# Patient Record
Sex: Female | Born: 1955 | Race: White | Hispanic: Yes | Marital: Married | Smoking: Never smoker
Health system: Southern US, Community
[De-identification: ages and names within clinical notes are randomized; demographics above are authoritative.]

## PROBLEM LIST (undated history)

## (undated) DIAGNOSIS — F419 Anxiety disorder, unspecified: Secondary | ICD-10-CM

## (undated) DIAGNOSIS — M810 Age-related osteoporosis without current pathological fracture: Secondary | ICD-10-CM

## (undated) DIAGNOSIS — J45909 Unspecified asthma, uncomplicated: Secondary | ICD-10-CM

## (undated) HISTORY — PX: POLYPECTOMY: SHX149

## (undated) HISTORY — DX: Age-related osteoporosis without current pathological fracture: M81.0

## (undated) HISTORY — DX: Unspecified asthma, uncomplicated: J45.909

## (undated) HISTORY — DX: Anxiety disorder, unspecified: F41.9

---

## 2017-02-21 LAB — HM MAMMOGRAPHY

## 2018-03-27 LAB — HM MAMMOGRAPHY

## 2019-02-23 LAB — HEPATIC FUNCTION PANEL
ALT: 63 — AB (ref 7–35)
AST: 45 — AB (ref 13–35)
Alkaline Phosphatase: 132 — AB (ref 25–125)
Bilirubin, Total: 0.7

## 2019-02-23 LAB — HEMOGLOBIN A1C: Hemoglobin A1C: >14

## 2019-02-23 LAB — LIPID PANEL
Cholesterol: 213 — AB (ref 0–200)
HDL: 39 (ref 35–70)
LDL Cholesterol: 118
Triglycerides: 278 — AB (ref 40–160)

## 2019-02-23 LAB — BASIC METABOLIC PANEL
BUN: 14 (ref 4–21)
CO2: 24 — AB (ref 13–22)
Chloride: 100 (ref 99–108)
Creatinine: 1.1 (ref 0.5–1.1)
Glucose: 403
Potassium: 3.9 (ref 3.4–5.3)
Sodium: 135 — AB (ref 137–147)

## 2019-02-23 LAB — COMPREHENSIVE METABOLIC PANEL
Albumin: 3.8 (ref 3.5–5.0)
Calcium: 9.3 (ref 8.7–10.7)
GFR calc non Af Amer: 50
Globulin: 3.9

## 2019-04-10 LAB — HM MAMMOGRAPHY

## 2019-10-26 LAB — BASIC METABOLIC PANEL
BUN: 18 (ref 4–21)
CO2: 25 — AB (ref 13–22)
Chloride: 104 (ref 99–108)
Creatinine: 1.2 — AB (ref 0.5–1.1)
Glucose: 146
Potassium: 4.5 (ref 3.4–5.3)
Sodium: 141 (ref 137–147)

## 2019-10-26 LAB — CBC: RBC: 5.3 — AB (ref 3.87–5.11)

## 2019-10-26 LAB — COMPREHENSIVE METABOLIC PANEL
Albumin: 4.7 (ref 3.5–5.0)
Calcium: 10.1 (ref 8.7–10.7)
GFR calc non Af Amer: 50
Globulin: 2.7

## 2019-10-26 LAB — LIPID PANEL
Cholesterol: 271 — AB (ref 0–200)
HDL: 39 (ref 35–70)
LDL Cholesterol: 197
Triglycerides: 171 — AB (ref 40–160)

## 2019-10-26 LAB — HEPATIC FUNCTION PANEL
ALT: 25 (ref 7–35)
AST: 21 (ref 13–35)
Alkaline Phosphatase: 97 (ref 25–125)
Bilirubin, Direct: 0.2 (ref 0.01–0.4)
Bilirubin, Total: 0.5

## 2019-10-26 LAB — CBC AND DIFFERENTIAL
HCT: 49 — AB (ref 36–46)
Hemoglobin: 15.7 (ref 12.0–16.0)
Neutrophils Absolute: 3
Platelets: 295 (ref 150–399)
WBC: 5.8

## 2019-10-26 LAB — HEMOGLOBIN A1C: Hemoglobin A1C: 8.7

## 2019-10-26 LAB — TSH: TSH: 1.32 (ref 0.41–5.90)

## 2019-10-26 LAB — HM DEXA SCAN

## 2019-10-26 LAB — VITAMIN D 25 HYDROXY (VIT D DEFICIENCY, FRACTURES): Vit D, 25-Hydroxy: 37.4

## 2020-04-19 ENCOUNTER — Other Ambulatory Visit: Payer: Self-pay | Admitting: Family Medicine

## 2020-04-19 ENCOUNTER — Ambulatory Visit: Payer: Self-pay | Admitting: Family Medicine

## 2020-04-19 ENCOUNTER — Encounter: Payer: Self-pay | Admitting: Family Medicine

## 2020-04-19 ENCOUNTER — Other Ambulatory Visit: Payer: Self-pay

## 2020-04-19 VITALS — BP 100/63 | HR 79 | Temp 97.5°F | Resp 16 | Ht 65.0 in | Wt 137.6 lb

## 2020-04-19 DIAGNOSIS — Z124 Encounter for screening for malignant neoplasm of cervix: Secondary | ICD-10-CM

## 2020-04-19 DIAGNOSIS — J4520 Mild intermittent asthma, uncomplicated: Secondary | ICD-10-CM | POA: Diagnosis not present

## 2020-04-19 DIAGNOSIS — E1169 Type 2 diabetes mellitus with other specified complication: Secondary | ICD-10-CM | POA: Insufficient documentation

## 2020-04-19 DIAGNOSIS — Z7689 Persons encountering health services in other specified circumstances: Secondary | ICD-10-CM

## 2020-04-19 DIAGNOSIS — E1165 Type 2 diabetes mellitus with hyperglycemia: Secondary | ICD-10-CM | POA: Diagnosis not present

## 2020-04-19 DIAGNOSIS — E538 Deficiency of other specified B group vitamins: Secondary | ICD-10-CM | POA: Diagnosis not present

## 2020-04-19 DIAGNOSIS — Z0000 Encounter for general adult medical examination without abnormal findings: Secondary | ICD-10-CM

## 2020-04-19 DIAGNOSIS — G720 Drug-induced myopathy: Secondary | ICD-10-CM | POA: Insufficient documentation

## 2020-04-19 DIAGNOSIS — E785 Hyperlipidemia, unspecified: Secondary | ICD-10-CM

## 2020-04-19 DIAGNOSIS — K760 Fatty (change of) liver, not elsewhere classified: Secondary | ICD-10-CM | POA: Insufficient documentation

## 2020-04-19 DIAGNOSIS — L237 Allergic contact dermatitis due to plants, except food: Secondary | ICD-10-CM

## 2020-04-19 DIAGNOSIS — J45909 Unspecified asthma, uncomplicated: Secondary | ICD-10-CM | POA: Insufficient documentation

## 2020-04-19 DIAGNOSIS — Z1159 Encounter for screening for other viral diseases: Secondary | ICD-10-CM

## 2020-04-19 DIAGNOSIS — E1121 Type 2 diabetes mellitus with diabetic nephropathy: Secondary | ICD-10-CM | POA: Insufficient documentation

## 2020-04-19 DIAGNOSIS — Z1211 Encounter for screening for malignant neoplasm of colon: Secondary | ICD-10-CM

## 2020-04-19 DIAGNOSIS — E559 Vitamin D deficiency, unspecified: Secondary | ICD-10-CM

## 2020-04-19 LAB — POCT UA - MICROALBUMIN: Microalbumin Ur, POC: 0 mg/L

## 2020-04-19 MED ORDER — TRIAMCINOLONE ACETONIDE 0.5 % EX CREA: 1.0000 "application " | TOPICAL_CREAM | Freq: Two times a day (BID) | CUTANEOUS | 0 refills | Status: DC

## 2020-04-19 MED ORDER — ALBUTEROL SULFATE HFA 108 (90 BASE) MCG/ACT IN AERS: 2.0000 | INHALATION_SPRAY | RESPIRATORY_TRACT | 2 refills | Status: DC | PRN

## 2020-04-19 NOTE — Patient Instructions (Addendum)
Thank you for coming to the office today.  Poison ivy - use topical steroid cream twice a day for 1-2 weeks.  Colon Cancer Screening: - For all adults age 62+ routine colon cancer screening is highly recommended.     - Recent guidelines from [place] recommend starting age of 61 - Early detection of colon cancer is important, because often there are no warning signs or symptoms, also if found early usually it can be cured. Late stage is hard to treat.  - If you are not interested in Colonoscopy screening (if done and normal you could be cleared for 5 to 10 years until next due), then Cologuard is an excellent alternative for screening test for Colon Cancer. It is highly sensitive for detecting DNA of colon cancer from even the earliest stages. Also, there is NO bowel prep required. - If Cologuard is NEGATIVE, then it is good for 3 years before next due - If Cologuard is POSITIVE, then it is strongly advised to get a Colonoscopy, which allows the GI doctor to locate the source of the cancer or polyp (even very early stage) and treat it by removing it. ------------------------- ORDERED Cologuard  Follow instructions to collect sample, you may call the company for any help or questions, 24/7 telephone support at [phone].  ------------------------------  Referral to GYN today. Stay tuned they will call  [facility] [address] [place], [place] [address] Hours: [place] Main: [place]   Address: [address], [place], [place], [place], [place] [address] Hours: 8AM-5PM Phone: [phone]  -------------------------------  Your provider would like to you have your annual eye exam. Please contact your current eye doctor or here are some good options for you to contact.   [facility]   Address: [address] [place], [place] [address] Phone: [phone]  Website: [email or web address] [address], [place], [place] [address] Phone: [phone] [email or web address]  [facility]  Address: [place], [place], [place] [address] Phone: [phone]   [facility] [address] [place], [place] [address] Phone: [phone]  [facility] Address: [place], [place], [place] [address]  Phone: [phone]    DUE for [place] (no food or drink after midnight before the lab appointment, only water or coffee without cream/sugar on the morning of)  SCHEDULE "Lab Only" visit in the morning at the clinic for lab draw in [place]   - Make sure Lab Only appointment is at about 1 week before your next appointment, so that results will be available  For Lab Results, once available within 2-3 days of blood draw, you can can log in to MyChart online to view your results and a brief explanation. Also, we can discuss results at next follow-up visit.     Please schedule a Follow-up Appointment to: Return in about 4 weeks (around 05/17/2020) for Annual Physical.  If you have any other questions or concerns, please feel free to call the office or send a message through [place]. You may also schedule an earlier appointment if necessary.  Additionally, you may be receiving a survey about your experience at our office within a few days to 1 week by e-mail or mail. We value your feedback.  Nobie Putnam, DO [place]

## 2020-04-19 NOTE — Progress Notes (Signed)
Subjective:    Patient ID: Holly Steele, female    DOB: Dec 16, 1955, 64 y.o.   MRN: [id number]  Holly Steele is a 64 y.o. female presenting on 04/19/2020 for Establish Care (rash may be heat rash per pt onset last week)  Husband Zenia Resides my patient recently established.  Moved to [place] from [place] to be closer to his son who works for [employer], now lives with them, and his wife has retired.  HPI   CHRONIC DM, Type 2: Reports history of low sugars in past then dx DM2. She did change lifestyle diet to Vegan She says difficulty with managing diet overall. CBGs - continuous glucose monitor, DexCom6  Meds: Ozempic 1mg  weekly inj, Jardiance 25mg  daily. Reports good compliance. Tolerating well w/o side-effects Not on ACEi/ARB due for urine microalbumin Lifestyle: - Diet (better low carb diet overall)  Denies hypoglycemia, polyuria, visual changes, numbness or tingling.  Vitamin Deficiency B12 Malabsorption Previously would receive Vitamin B12 vaccine. Taking oral Vitamin B12 1018mcg She admits vegan diet Due for testing  Hyperlipidemia / Fatty Liver History of chronic elevated cholesterol with complication of liver. Prior lab testing. Failed Rosuvastatin 20mg  due to myalgias She admits more sensitive to medications  History of Childhood Asthma Outgrew around age 70 She says still can be triggered by heavier smoke or fumes can get mild wheezing. She does not have a rescue inhaler for now.  Osteopenia Previously bone mineral density DEXA 10/2019 On Alendronate 70mg  weekly   Health Maintenance: UTD COVID19 vaccine  Colon CA Screening: Last Colonoscopy done 10-12 years ago GI in [place], no results but she says it was normal, will request record. Currently asymptomatic. No known family history of colon CA. Due for screening test - cologuard  Previously routine pap smear every 1-3 years, she has had abnormal pap in past, ultimately negative. Needs new GYN  Depression  screen PHQ 2/9 04/19/2020  Decreased Interest 0  Down, Depressed, Hopeless 0  PHQ - 2 Score 0    Past Medical History:  Diagnosis Date  . Asthma    past  . Osteoporosis    Past Surgical History:  Procedure Laterality Date  . POLYPECTOMY     Social History   Socioeconomic History  . Marital status: Married    Spouse name: Not on file  . Number of children: Not on file  . Years of education: Not on file  . Highest education level: Not on file  Occupational History  . Not on file  Tobacco Use  . Smoking status: Never Smoker  . Smokeless tobacco: Never Used  Substance and Sexual Activity  . Alcohol use: Yes  . Drug use: Never  . Sexual activity: Not on file  Other Topics Concern  . Not on file  Social History Narrative  . Not on file   Social Determinants of Health   Financial Resource Strain:   . Difficulty of Paying Living Expenses:   Food Insecurity:   . Worried About Charity fundraiser in the Last Year:   . Arboriculturist in the Last Year:   Transportation Needs:   . Film/video editor (Medical):   [name] Lack of Transportation (Non-Medical):   Physical Activity:   . Days of Exercise per Week:   . Minutes of Exercise per Session:   Stress:   . Feeling of Stress :   Social Connections:   . Frequency of Communication with Friends and Family:   . Frequency of Social Gatherings with  Friends and Family:   . Attends Religious Services:   . Active Member of Clubs or Organizations:   . Attends Archivist Meetings:   [name] Marital Status:   Intimate Partner Violence:   . Fear of Current or Ex-Partner:   . Emotionally Abused:   [name] Physically Abused:   . Sexually Abused:    History reviewed. No pertinent family history. Current Outpatient Medications on File Prior to Visit  Medication Sig  . alendronate (FOSAMAX) 70 MG tablet   . calcium acetate, Phos Binder, (PHOSLYRA) 667 MG/5ML SOLN Take 667 mg by mouth 3 (three) times daily with meals.  .  calcium-vitamin D (OSCAL WITH D) 500-200 MG-UNIT tablet Take 1 tablet by mouth.  . co-enzyme Q-10 30 MG capsule Take 30 mg by mouth daily.  [name] GLUTAMINE PO Take 1,000 mcg by mouth.  [name] JARDIANCE 25 MG TABS tablet Take 25 mg by mouth daily.  [name] OZEMPIC, 1 MG/DOSE, 2 MG/1.5ML SOPN Inject 1 mg into the skin once a week.  . traZODone (DESYREL) 100 MG tablet   . vitamin B-12 (CYANOCOBALAMIN) 1000 MCG tablet Take 1,000 mcg by mouth daily.   No current facility-administered medications on file prior to visit.    Review of Systems Per HPI unless specifically indicated above      Objective:    BP 100/63   Pulse 79   Temp (!) 97.5 F (36.4 C) (Temporal)   Resp 16   Ht 5\' 5"  (1.651 m)   Wt 137 lb 9.6 oz (62.4 kg)   SpO2 100%   BMI 22.90 kg/m   Wt Readings from Last 3 Encounters:  04/19/20 137 lb 9.6 oz (62.4 kg)    Physical Exam Vitals and nursing note reviewed.  Constitutional:      General: She is not in acute distress.    Appearance: She is well-developed. She is not diaphoretic.     Comments: Well-appearing, comfortable, cooperative  HENT:     Head: Normocephalic and atraumatic.  Eyes:     General:        Right eye: No discharge.        Left eye: No discharge.     Conjunctiva/sclera: Conjunctivae normal.  Cardiovascular:     Rate and Rhythm: Normal rate.  Pulmonary:     Effort: Pulmonary effort is normal.  Skin:    General: Skin is warm and dry.     Findings: Rash (several linear excoriations and patches on upper arms healing) present. No erythema.  Neurological:     Mental Status: She is alert and oriented to person, place, and time.  Psychiatric:        Behavior: Behavior normal.     Comments: Well groomed, good eye contact, normal speech and thoughts      Diabetic Foot Exam - Simple   Simple Foot Form Diabetic Foot exam was performed with the following findings: Yes 04/19/2020 11:20 AM  Visual Inspection No deformities, no ulcerations, no other skin breakdown  bilaterally: Yes Sensation Testing Intact to touch and monofilament testing bilaterally: Yes Pulse Check Posterior Tibialis and Dorsalis pulse intact bilaterally: Yes Comments     No results found for this or any previous visit.    Assessment & Plan:   Problem List Items Addressed This Visit    Vitamin B12 deficiency   Relevant Orders   Vitamin B12   Type 2 diabetes mellitus with hyperglycemia, without long-term current use of insulin ([place]) - Primary   Relevant Medications  JARDIANCE 25 MG TABS tablet   OZEMPIC, 1 MG/DOSE, 2 MG/1.5ML SOPN   Other Relevant Orders   POCT UA - Microalbumin   C-peptide   Hyperlipidemia associated with type 2 diabetes mellitus ([place])   Relevant Medications   JARDIANCE 25 MG TABS tablet   OZEMPIC, 1 MG/DOSE, 2 MG/1.5ML SOPN   Drug-induced myopathy   Asthma in adult   Relevant Medications   albuterol (VENTOLIN HFA) 108 (90 Base) MCG/ACT inhaler    Other Visit Diagnoses    Encounter to establish care with new doctor       Screen for colon cancer       Relevant Orders   Cologuard   Allergic dermatitis due to poison ivy       Relevant Medications   triamcinolone cream (KENALOG) 0.5 %   Vitamin D deficiency       Relevant Orders   VITAMIN D 25 Hydroxy (Vit-D Deficiency, Fractures)      Request outside records - [facility] - Dr Golden Circle MD - prior PCP [facility] records 2018-2020 - [place], colonoscopy initial screening 2009, age 53 - [place] for Mammogram May 2020  #Type 2 DM Prior report had hyperglycemia, complication No hypoglycemia Has done fairly well lately higher sugars Has continuous glucose monitor On ozempic 1mg  weekly and Jardiance 25mg  daily - she has enough meds Urine microalbumin negative 0 today DM Foot exam today Handout for DM eye providers to schedule Follow-up A1c and labs 1  month  Hyperlipidemia Secondary to DM History of hepatic steatosis Drug myopathy on statin - may consider other doses after upcoming lab panel Reduce from Rosuva 20 daily to reduced dose likely next option  #Vitamin D / B12 Deficiency History of vitamin Deficiency related to diabetes and nutrition She was on vegan diet Prior improved w/ B12 injections, now on oral supplement 1048mcg daily Check Lab B12, Vitamin D Upcoming labs for annual in 1 month for rest of blood panel CBC etc.  #Poison Ivy Dermatitis 1 week ago onset exposure and onset rash on arms classic for poison ivy Trial on topical Triamcinolone 0.5% BID 1-2 weeks, now that it is already healing, defer prednisone in diabetic patient for now Precautions reviewed  Due for routine colon cancer screening. 1st screening colonoscopy done 2009 [facility], request record No fam history colon CA - Discussion today about recommendations for either Colonoscopy or Cologuard screening, benefits and risks of screening, interested in Cologuard, understands that if positive then recommendation is for diagnostic colonoscopy to follow-up. - Ordered Cologuard today  Orders Placed This Encounter  Procedures  . Cologuard  . Vitamin B12  . VITAMIN D 25 Hydroxy (Vit-D Deficiency, Fractures)  . C-peptide  . Ambulatory referral to Obstetrics / Gynecology    Referral Priority:   Routine    Referral Type:   Consultation    Referral Reason:   Specialty Services Required    Requested Specialty:   Obstetrics and Gynecology    Number of Visits Requested:   1  . POCT UA - Microalbumin     Meds ordered this encounter  Medications  . albuterol (VENTOLIN HFA) 108 (90 Base) MCG/ACT inhaler    Sig: Inhale 2 puffs into the lungs every 4 (four) hours as needed for wheezing or shortness of breath (cough).    Dispense:  6.7 g    Refill:  2  . triamcinolone cream (  KENALOG) 0.5 %    Sig: Apply 1 application topically 2 (two) times daily. To affected  areas, for up to 2 weeks.    Dispense:  30 g    Refill:  0   referral to GYN first available, routine pap smear, prior records [place].   Follow up plan: Return in about 4 weeks (around 05/17/2020) for Annual Physical.   Future labs ordered for 4-6 weeks - CMET Lipid A1c CBC Hep C, TSH   Nobie Putnam, DO [facility] 04/19/2020, 10:43 AM

## 2020-04-19 NOTE — Addendum Note (Signed)
Addended by: Olin Hauser on: 04/19/2020 01:11 PM   Modules accepted: Orders

## 2020-04-20 ENCOUNTER — Telehealth: Payer: Self-pay | Admitting: Family Medicine

## 2020-04-20 DIAGNOSIS — F5101 Primary insomnia: Secondary | ICD-10-CM

## 2020-04-20 LAB — VITAMIN D 25 HYDROXY (VIT D DEFICIENCY, FRACTURES): Vit D, 25-Hydroxy: 70 ng/mL (ref 30–100)

## 2020-04-20 LAB — C-PEPTIDE: C-Peptide: 1.34 ng/mL (ref 0.80–3.85)

## 2020-04-20 LAB — VITAMIN B12: Vitamin B-12: 1556 pg/mL — ABNORMAL HIGH (ref 200–1100)

## 2020-04-20 MED ORDER — TRAZODONE HCL 100 MG PO TABS: 100.0000 mg | ORAL_TABLET | Freq: Every day | ORAL | 3 refills | Status: DC

## 2020-04-20 NOTE — Telephone Encounter (Signed)
Medication Refill - Medication:  traZODone (DESYREL) 100 MG tablet  Pt just seen Dr Raliegh Ip yesterday   Has the patient contacted their pharmacy? No. (Agent: If no, request that the patient contact the pharmacy for the refill.) (Agent: If yes, when and what did the pharmacy advise?)  Preferred Pharmacy (with phone number or street name): [facility] mail order   Agent: Please be advised that RX refills may take up to 3 business days. We ask that you follow-up with your pharmacy.

## 2020-04-23 ENCOUNTER — Encounter: Payer: Self-pay | Admitting: Family Medicine

## 2020-04-25 LAB — HM DIABETES EYE EXAM

## 2020-04-27 LAB — COLOGUARD: Cologuard: NEGATIVE

## 2020-05-01 ENCOUNTER — Encounter: Payer: Self-pay | Admitting: Family Medicine

## 2020-05-01 LAB — COLOGUARD: COLOGUARD: NEGATIVE

## 2020-05-05 ENCOUNTER — Other Ambulatory Visit: Payer: Self-pay

## 2020-05-05 ENCOUNTER — Telehealth: Payer: Self-pay

## 2020-05-05 ENCOUNTER — Ambulatory Visit (INDEPENDENT_AMBULATORY_CARE_PROVIDER_SITE_OTHER): Payer: No Typology Code available for payment source | Admitting: Family Medicine

## 2020-05-05 ENCOUNTER — Encounter: Payer: Self-pay | Admitting: Family Medicine

## 2020-05-05 VITALS — BP 100/60 | HR 100 | Temp 97.9°F | Ht 65.0 in | Wt 140.0 lb

## 2020-05-05 DIAGNOSIS — R21 Rash and other nonspecific skin eruption: Secondary | ICD-10-CM

## 2020-05-05 DIAGNOSIS — L237 Allergic contact dermatitis due to plants, except food: Secondary | ICD-10-CM

## 2020-05-05 MED ORDER — PREDNISONE 10 MG PO TABS: ORAL_TABLET | ORAL | 0 refills | Status: AC

## 2020-05-05 NOTE — Telephone Encounter (Signed)
Called pt to s

## 2020-05-05 NOTE — Telephone Encounter (Signed)
Called pt appt is schedule for today 05/05/2020.  KP

## 2020-05-05 NOTE — Telephone Encounter (Signed)
Called pt with Negative Cologaurd results. Pt verbalized understanding. Pt said her poison ivy rash has gotten bigger. The dermatologist cant see her until Oct. Wants to know if you can prescribe her something for her to go pick up or if you wanted to see her in office.  KP

## 2020-05-05 NOTE — Patient Instructions (Signed)
As we discussed, I have sent in a prescription for prednisone to take on a taper over the next 9 days.  Be sure to keep the skin clean and dry.  This medication will increase your blood sugar, be sure to be mindful of this and be aware of your sugar and carbohydrate intake over these next two weeks.  We will plan to see you back as needed for this  You will receive a survey after today's visit either digitally by e-mail or paper by [place] mail. Your experiences and feedback matter to [place].  Please respond so we know how we are doing as we provide care for you.  Call us with any questions/concerns/needs.  It is my goal to be available to you for your health concerns.  Thanks for choosing me to be a partner in your healthcare needs!  Harlin Rain, FNP-C Family Nurse Practitioner [facility] Phone: [phone]

## 2020-05-05 NOTE — Progress Notes (Signed)
Subjective:    Patient ID: Holly Steele, female    DOB: 01-30-1956, 64 y.o.   MRN: [id number]  Holly Steele is a 64 y.o. female presenting on 05/05/2020 for Poison Ivy (new spots popped up for X3 days, left and right arm, itching, red)   HPI  Holly Steele presents to clinic for concerns of rash that started approximately 3 days ago, with redness to bilateral arms with itching.  Recently went camping and reports may have been exposed to poison oak/ivy.  Denies any itching/pain/rash near/around eyes or genitalia.  Has used triamcinolone topically without relief of symptoms.  Depression screen [facility] 2/9 05/05/2020 04/19/2020  Decreased Interest 0 0  Down, Depressed, Hopeless 0 0  PHQ - 2 Score 0 0  Altered sleeping 2 -  Tired, decreased energy 0 -  Change in appetite 0 -  Feeling bad or failure about yourself  0 -  Trouble concentrating 0 -  Moving slowly or fidgety/restless 0 -  Suicidal thoughts 0 -  PHQ-9 Score 2 -  Difficult doing work/chores Not difficult at all -    Social History   Tobacco Use  . Smoking status: Never Smoker  . Smokeless tobacco: Never Used  Substance Use Topics  . Alcohol use: Yes  . Drug use: Never    Review of Systems  Constitutional: Negative.   HENT: Negative.   Eyes: Negative.   Respiratory: Negative.   Cardiovascular: Negative.   Gastrointestinal: Negative.   Endocrine: Negative.   Genitourinary: Negative.   Musculoskeletal: Negative.   Skin: Positive for rash. Negative for pallor and wound.  Allergic/Immunologic: Negative.   Neurological: Negative.   Hematological: Negative.   Psychiatric/Behavioral: Negative.    Per HPI unless specifically indicated above     Objective:    BP 100/60   Pulse 100   Temp 97.9 F (36.6 C) (Oral)   Ht 5\' 5"  (1.651 m)   Wt 140 lb (63.5 kg)   SpO2 100%   BMI 23.30 kg/m   Wt Readings from Last 3 Encounters:  05/05/20 140 lb (63.5 kg)  04/19/20 137 lb 9.6 oz (62.4 kg)    Physical  Exam Vitals reviewed.  Constitutional:      General: She is not in acute distress.    Appearance: Normal appearance. She is well-developed, well-groomed and normal weight. She is not ill-appearing or toxic-appearing.  HENT:     Head: Normocephalic and atraumatic.     Nose:     Comments: Holly Steele is in place, covering mouth and nose. Eyes:     General: Lids are normal. Vision grossly intact.        Right eye: No discharge.        Left eye: No discharge.     Extraocular Movements: Extraocular movements intact.     Conjunctiva/sclera: Conjunctivae normal.     Pupils: Pupils are equal, round, and reactive to light.  Cardiovascular:     Rate and Rhythm: Normal rate and regular rhythm.     Pulses: Normal pulses.          Dorsalis pedis pulses are 2+ on the right side and 2+ on the left side.     Heart sounds: Normal heart sounds. No murmur heard.  No friction rub. No gallop.   Pulmonary:     Effort: Pulmonary effort is normal. No respiratory distress.     Breath sounds: Normal breath sounds.  Musculoskeletal:     Right lower leg: No edema.     Left  lower leg: No edema.  Skin:    General: Skin is warm and dry.     Capillary Refill: Capillary refill takes less than 2 seconds.     Findings: Rash present.     Comments: Poison ivy/oak like in appearance on bilateral upper arms with multiple excoriations.  No vesicles noted on exam.  Patient scratching throughout appointment.  Neurological:     General: No focal deficit present.     Mental Status: She is alert and oriented to person, place, and time.  Psychiatric:        Attention and Perception: Attention and perception normal.        Mood and Affect: Mood and affect normal.        Speech: Speech normal.        Behavior: Behavior normal. Behavior is cooperative.        Thought Content: Thought content normal.        Cognition and Memory: Cognition and memory normal.        Judgment: Judgment normal.    Results for orders placed or  performed in visit on 05/05/20  HM DIABETES EYE EXAM  Result Value Ref Range   HM Diabetic Eye Exam No Retinopathy No Retinopathy      Assessment & Plan:   Problem List Items Addressed This Visit      Musculoskeletal and Integument   Rash and nonspecific skin eruption    Bilateral arms with rash and excoriation.  Poison ivy like in appearance.  No vesicles noted on exam.  Discussed treatment with triamcinolone cream vs oral steroids.  Discussed that steroids will raise her glucose and to be aware of that over the next 3 months.  To pay close attention to the foods/drinks she is taking in orally and be mindful of sugar and carbohydrates.  Plan: 1. Begin prednisone taper today 2. Avoid lotions/ointments/creams, we want the rash to dry up and scab over 3. Wash clothes/sheets in hot water and dry on high heat to eradicate the oil 4. Follow up PRN       Other Visit Diagnoses    Poison ivy dermatitis    -  Primary   Relevant Medications   predniSONE (DELTASONE) 10 MG tablet      Meds ordered this encounter  Medications  . predniSONE (DELTASONE) 10 MG tablet    Sig: Take 3 tablets (30 mg total) by mouth daily with breakfast for 3 days, THEN 2 tablets (20 mg total) daily with breakfast for 3 days, THEN 1 tablet (10 mg total) daily with breakfast for 3 days.    Dispense:  18 tablet    Refill:  0      Follow up plan: Return if symptoms worsen or fail to improve.   Harlin Rain, [place] Family Nurse Practitioner [facility] 05/05/2020, 4:41 PM

## 2020-05-05 NOTE — Assessment & Plan Note (Signed)
Bilateral arms with rash and excoriation.  Poison ivy like in appearance.  No vesicles noted on exam.  Discussed treatment with triamcinolone cream vs oral steroids.  Discussed that steroids will raise her glucose and to be aware of that over the next 3 months.  To pay close attention to the foods/drinks she is taking in orally and be mindful of sugar and carbohydrates.  Plan: 1. Begin prednisone taper today 2. Avoid lotions/ointments/creams, we want the rash to dry up and scab over 3. Wash clothes/sheets in hot water and dry on high heat to eradicate the oil 4. Follow up PRN

## 2020-05-05 NOTE — Telephone Encounter (Signed)
Patient scheduled w/ Cyndia Skeeters FNP  Nobie Putnam, DO [facility] 05/05/2020, 10:36 AM

## 2020-05-09 ENCOUNTER — Encounter: Payer: Self-pay | Admitting: Obstetrics and Gynecology

## 2020-05-09 ENCOUNTER — Ambulatory Visit (INDEPENDENT_AMBULATORY_CARE_PROVIDER_SITE_OTHER): Payer: No Typology Code available for payment source | Admitting: Obstetrics and Gynecology

## 2020-05-09 ENCOUNTER — Other Ambulatory Visit (HOSPITAL_COMMUNITY)
Admission: RE | Admit: 2020-05-09 | Discharge: 2020-05-09 | Disposition: A | Discharge status: Home / Self Care | Payer: 59 | Source: Ambulatory Visit | Attending: Obstetrics and Gynecology | Admitting: Obstetrics and Gynecology

## 2020-05-09 ENCOUNTER — Other Ambulatory Visit: Payer: Self-pay

## 2020-05-09 VITALS — BP 112/69 | HR 84 | Ht 65.0 in | Wt 142.9 lb

## 2020-05-09 DIAGNOSIS — Z124 Encounter for screening for malignant neoplasm of cervix: Secondary | ICD-10-CM | POA: Diagnosis not present

## 2020-05-09 DIAGNOSIS — Z01419 Encounter for gynecological examination (general) (routine) without abnormal findings: Secondary | ICD-10-CM

## 2020-05-09 DIAGNOSIS — Z1231 Encounter for screening mammogram for malignant neoplasm of breast: Secondary | ICD-10-CM

## 2020-05-09 NOTE — Addendum Note (Signed)
Addended by: Durwin Glaze on: 05/09/2020 03:44 PM   Modules accepted: Orders

## 2020-05-09 NOTE — Progress Notes (Signed)
HPI:      [name] is a 65 y.o. No obstetric history on file. who LMP was No LMP recorded. Patient is postmenopausal.  Subjective:   She presents today for her annual examination.  She has no complaints. She is in menopause and takes vitamin D calcium and Fosamax. She is currently not sexually active. She describes a remote history of abnormal Pap smear and she underwent some type of "procedure".  Her Pap smears have been normal since that time. She has diabetes and generally keeps good control of this although she recently had an outbreak of poison ivy and took steroids which has caused issues with her diabetes.  Additionally approximately a year ago she became a vegan and this caused significant issues with her sugar as well.  She changed medication and is no longer a vegan and has noticed an improvement in her glycemic control.    Hx: The following portions of the patient's history were reviewed and updated as appropriate:             She  has a past medical history of Asthma and Osteoporosis. She does not have any pertinent problems on file. She  has a past surgical history that includes Polypectomy. Her family history includes Alcohol abuse in her father; Diabetes in her brother, father, and sister; Hypertension in her mother. She  reports that she has never smoked. She has never used smokeless tobacco. She reports current alcohol use. She reports that she does not use drugs. She has a current medication list which includes the following prescription(s): albuterol, alendronate, calcium acetate (phos binder), calcium-vitamin d, co-enzyme q-10, dexcom g6 sensor, dexcom g6 transmitter, glutamine, jardiance, ozempic (1 mg/dose), prednisone, trazodone, triamcinolone cream, vitamin b-12, and rosuvastatin. She is allergic to keflet [cephalexin].       Review of Systems:  Review of Systems  Constitutional: Denied constitutional symptoms, night sweats, recent illness, fatigue, fever,  insomnia and weight loss.  Eyes: Denied eye symptoms, eye pain, photophobia, vision change and visual disturbance.  Ears/Nose/Throat/Neck: Denied ear, nose, throat or neck symptoms, hearing loss, nasal discharge, sinus congestion and sore throat.  Cardiovascular: Denied cardiovascular symptoms, arrhythmia, chest pain/pressure, edema, exercise intolerance, orthopnea and palpitations.  Respiratory: Denied pulmonary symptoms, asthma, pleuritic pain, productive sputum, cough, dyspnea and wheezing.  Gastrointestinal: Denied, gastro-esophageal reflux, melena, nausea and vomiting.  Genitourinary: Denied genitourinary symptoms including symptomatic vaginal discharge, pelvic relaxation issues, and urinary complaints.  Musculoskeletal: Denied musculoskeletal symptoms, stiffness, swelling, muscle weakness and myalgia.  Dermatologic: Denied dermatology symptoms, rash and scar.  Neurologic: Denied neurology symptoms, dizziness, headache, neck pain and syncope.  Psychiatric: Denied psychiatric symptoms, anxiety and depression.  Endocrine: Denied endocrine symptoms including hot flashes and night sweats.   Meds:   Current Outpatient Medications on File Prior to Visit  Medication Sig Dispense Refill  . albuterol (VENTOLIN HFA) 108 (90 Base) MCG/ACT inhaler Inhale 2 puffs into the lungs every 4 (four) hours as needed for wheezing or shortness of breath (cough). 6.7 g 2  . alendronate (FOSAMAX) 70 MG tablet     . calcium acetate, Phos Binder, (PHOSLYRA) 667 MG/5ML SOLN Take 667 mg by mouth 3 (three) times daily with meals.    . calcium-vitamin D (OSCAL WITH D) 500-200 MG-UNIT tablet Take 1 tablet by mouth.    . co-enzyme Q-10 30 MG capsule Take 30 mg by mouth daily.    . Continuous Blood Gluc Sensor (DEXCOM G6 SENSOR) MISC SMARTSIG:1 Each Topical Every 10 Days    .  Continuous Blood Gluc Transmit (DEXCOM G6 TRANSMITTER) MISC     . GLUTAMINE PO Take 1,000 mcg by mouth.    [name] JARDIANCE 25 MG TABS tablet Take 25  mg by mouth daily.    [name] OZEMPIC, 1 MG/DOSE, 2 MG/1.5ML SOPN Inject 1 mg into the skin once a week.    . predniSONE (DELTASONE) 10 MG tablet Take 3 tablets (30 mg total) by mouth daily with breakfast for 3 days, THEN 2 tablets (20 mg total) daily with breakfast for 3 days, THEN 1 tablet (10 mg total) daily with breakfast for 3 days. 18 tablet 0  . traZODone (DESYREL) 100 MG tablet Take 1 tablet (100 mg total) by mouth at bedtime. 90 tablet 3  . triamcinolone cream (KENALOG) 0.5 % Apply 1 application topically 2 (two) times daily. To affected areas, for up to 2 weeks. 30 g 0  . vitamin B-12 (CYANOCOBALAMIN) 1000 MCG tablet Take 1,000 mcg by mouth daily.    . rosuvastatin (CRESTOR) 20 MG tablet Take 20 mg by mouth at bedtime. (Patient not taking: Reported on 05/09/2020)     No current facility-administered medications on file prior to visit.    Objective:     Vitals:   05/09/20 1331  BP: 112/69  Pulse: 84              Physical examination General NAD, Conversant  HEENT Atraumatic; Op clear with mmm.  Normo-cephalic. Pupils reactive. Anicteric sclerae  Thyroid/Neck Smooth without nodularity or enlargement. Normal ROM.  Neck Supple.  Skin No rashes, lesions or ulceration. Normal palpated skin turgor. No nodularity.  Breasts: No masses or discharge.  Symmetric.  No axillary adenopathy.  Lungs: Clear to auscultation.No rales or wheezes. Normal Respiratory effort, no retractions.  Heart: NSR.  No murmurs or rubs appreciated. No periferal edema  Abdomen: Soft.  Non-tender.  No masses.  No HSM. No hernia  Extremities: Moves all appropriately.  Normal ROM for age. No lymphadenopathy.  Neuro: Oriented to PPT.  Normal mood. Normal affect.     Pelvic:   Vulva: Normal appearance.  No lesions.  Small atrophic introitus  Vagina: No lesions or abnormalities noted.  Moderate vaginal atrophy  Support: Normal pelvic support.  Urethra No masses tenderness or scarring.  Meatus Normal size without lesions  or prolapse.  Cervix: Normal appearance.  No lesions.  Anus: Normal exam.  No lesions.  Perineum: Normal exam.  No lesions.        Bimanual   Uterus: Normal size.  Non-tender.  Mobile.  AV.  Adnexae: No masses.  Non-tender to palpation.  Cul-de-sac: Negative for abnormality.      Assessment:    No obstetric history on file. Patient Active Problem List   Diagnosis Date Noted  . Rash and nonspecific skin eruption 05/05/2020  . Vitamin B12 deficiency 04/19/2020  . Type 2 diabetes mellitus with hyperglycemia, without long-term current use of insulin ([place]) 04/19/2020  . Asthma in adult 04/19/2020  . Hyperlipidemia associated with type 2 diabetes mellitus ([place]) 04/19/2020  . 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 Hepatic steatosis 04/19/2020     1. Well woman exam with routine gynecological exam   2. Encounter for screening mammogram for malignant neoplasm of breast        Plan:            1.  Basic Screening Recommendations The basic screening recommendations for asymptomatic women were discussed with the patient during her visit.  The age-appropriate recommendations were discussed with her  and the rational for the tests reviewed.  When I am informed by the patient that another primary care physician has previously obtained the age-appropriate tests and they are up-to-date, only outstanding tests are ordered and referrals given as necessary.  Abnormal results of tests will be discussed with her when all of her results are completed.  Routine preventative health maintenance measures emphasized: Exercise/Diet/Weight control, Tobacco Warnings, Alcohol/Substance use risks and Stress Management Pap performed-mammogram ordered 2.  Discussed importance of glycemic control. 3.  Continue calcium Fosamax and vitamin D.  Orders Orders Placed This Encounter  Procedures  . MM 3D SCREEN BREAST BILATERAL    No orders of the defined types were placed in this encounter.       F/U  Return in  about 1 year (around 05/09/2021) for Annual Physical.  Finis Bud, M.D. 05/09/2020 2:02 PM

## 2020-05-10 ENCOUNTER — Other Ambulatory Visit: Payer: No Typology Code available for payment source

## 2020-05-10 DIAGNOSIS — Z1159 Encounter for screening for other viral diseases: Secondary | ICD-10-CM

## 2020-05-10 DIAGNOSIS — E785 Hyperlipidemia, unspecified: Secondary | ICD-10-CM

## 2020-05-10 DIAGNOSIS — E1169 Type 2 diabetes mellitus with other specified complication: Secondary | ICD-10-CM

## 2020-05-10 DIAGNOSIS — E1165 Type 2 diabetes mellitus with hyperglycemia: Secondary | ICD-10-CM

## 2020-05-10 DIAGNOSIS — E538 Deficiency of other specified B group vitamins: Secondary | ICD-10-CM

## 2020-05-10 DIAGNOSIS — Z0000 Encounter for general adult medical examination without abnormal findings: Secondary | ICD-10-CM

## 2020-05-11 LAB — CBC WITH DIFFERENTIAL/PLATELET
Absolute Monocytes: 601 cells/uL (ref 200–950)
Basophils Absolute: 78 cells/uL (ref 0–200)
Basophils Relative: 1 %
Eosinophils Absolute: 172 cells/uL (ref 15–500)
Eosinophils Relative: 2.2 %
HCT: 45 % (ref 35.0–45.0)
Hemoglobin: 14.7 g/dL (ref 11.7–15.5)
Lymphs Abs: 3268 cells/uL (ref 850–3900)
MCH: 29.3 pg (ref 27.0–33.0)
MCHC: 32.7 g/dL (ref 32.0–36.0)
MCV: 89.6 fL (ref 80.0–100.0)
MPV: 10.8 fL (ref 7.5–12.5)
Monocytes Relative: 7.7 %
Neutro Abs: 3682 cells/uL (ref 1500–7800)
Neutrophils Relative %: 47.2 %
Platelets: 280 10*3/uL (ref 140–400)
RBC: 5.02 10*6/uL (ref 3.80–5.10)
RDW: 12.8 % (ref 11.0–15.0)
Total Lymphocyte: 41.9 %
WBC: 7.8 10*3/uL (ref 3.8–10.8)

## 2020-05-11 LAB — COMPLETE METABOLIC PANEL WITH GFR
AG Ratio: 1.7 (calc) (ref 1.0–2.5)
ALT: 16 U/L (ref 6–29)
AST: 13 U/L (ref 10–35)
Albumin: 4.5 g/dL (ref 3.6–5.1)
Alkaline phosphatase (APISO): 63 U/L (ref 37–153)
BUN/Creatinine Ratio: 27 (calc) — ABNORMAL HIGH (ref 6–22)
BUN: 30 mg/dL — ABNORMAL HIGH (ref 7–25)
CO2: 26 mmol/L (ref 20–32)
Calcium: 10 mg/dL (ref 8.6–10.4)
Chloride: 103 mmol/L (ref 98–110)
Creat: 1.11 mg/dL — ABNORMAL HIGH (ref 0.50–0.99)
GFR, Est African American: 62 mL/min/{1.73_m2} (ref >=60)
GFR, Est Non African American: 53 mL/min/{1.73_m2} — ABNORMAL LOW (ref >=60)
Globulin: 2.7 g/dL (calc) (ref 1.9–3.7)
Glucose, Bld: 166 mg/dL — ABNORMAL HIGH (ref 65–99)
Potassium: 3.7 mmol/L (ref 3.5–5.3)
Sodium: 139 mmol/L (ref 135–146)
Total Bilirubin: 0.6 mg/dL (ref 0.2–1.2)
Total Protein: 7.2 g/dL (ref 6.1–8.1)

## 2020-05-11 LAB — LIPID PANEL
Cholesterol: 283 mg/dL — ABNORMAL HIGH (ref <200)
HDL: 50 mg/dL (ref >=50)
LDL Cholesterol (Calc): 190 mg/dL (calc) — ABNORMAL HIGH
Non-HDL Cholesterol (Calc): 233 mg/dL (calc) — ABNORMAL HIGH (ref <130)
Total CHOL/HDL Ratio: 5.7 (calc) — ABNORMAL HIGH (ref <5.0)
Triglycerides: 231 mg/dL — ABNORMAL HIGH (ref <150)

## 2020-05-11 LAB — HEPATITIS C ANTIBODY
Hepatitis C Ab: NONREACTIVE
SIGNAL TO CUT-OFF: 0.01 (ref <1.00)

## 2020-05-11 LAB — CYTOLOGY - PAP
Comment: NEGATIVE
Diagnosis: NEGATIVE
High risk HPV: NEGATIVE

## 2020-05-11 LAB — TSH: TSH: 4.31 mIU/L (ref 0.40–4.50)

## 2020-05-11 LAB — HEMOGLOBIN A1C
Hgb A1c MFr Bld: 8.5 % of total Hgb — ABNORMAL HIGH (ref <5.7)
Mean Plasma Glucose: 197 (calc)
eAG (mmol/L): 10.9 (calc)

## 2020-05-15 ENCOUNTER — Encounter: Payer: Self-pay | Admitting: Family Medicine

## 2020-05-15 DIAGNOSIS — E1121 Type 2 diabetes mellitus with diabetic nephropathy: Secondary | ICD-10-CM | POA: Insufficient documentation

## 2020-05-15 DIAGNOSIS — N183 Chronic kidney disease, stage 3 unspecified: Secondary | ICD-10-CM | POA: Insufficient documentation

## 2020-05-17 ENCOUNTER — Encounter: Payer: Self-pay | Admitting: Family Medicine

## 2020-05-17 ENCOUNTER — Other Ambulatory Visit: Payer: Self-pay

## 2020-05-17 ENCOUNTER — Other Ambulatory Visit: Payer: Self-pay | Admitting: Family Medicine

## 2020-05-17 ENCOUNTER — Encounter: Payer: No Typology Code available for payment source | Admitting: Obstetrics and Gynecology

## 2020-05-17 ENCOUNTER — Ambulatory Visit (INDEPENDENT_AMBULATORY_CARE_PROVIDER_SITE_OTHER): Payer: No Typology Code available for payment source | Admitting: Family Medicine

## 2020-05-17 VITALS — BP 93/65 | HR 78 | Temp 96.9°F | Resp 16 | Ht 65.0 in | Wt 138.4 lb

## 2020-05-17 DIAGNOSIS — E1169 Type 2 diabetes mellitus with other specified complication: Secondary | ICD-10-CM

## 2020-05-17 DIAGNOSIS — E7801 Familial hypercholesterolemia: Secondary | ICD-10-CM

## 2020-05-17 DIAGNOSIS — E1121 Type 2 diabetes mellitus with diabetic nephropathy: Secondary | ICD-10-CM

## 2020-05-17 DIAGNOSIS — E1165 Type 2 diabetes mellitus with hyperglycemia: Secondary | ICD-10-CM

## 2020-05-17 DIAGNOSIS — G720 Drug-induced myopathy: Secondary | ICD-10-CM

## 2020-05-17 DIAGNOSIS — E785 Hyperlipidemia, unspecified: Secondary | ICD-10-CM

## 2020-05-17 DIAGNOSIS — N1831 Chronic kidney disease, stage 3a: Secondary | ICD-10-CM

## 2020-05-17 MED ORDER — PRALUENT 75 MG/ML ~~LOC~~ SOAJ: 75.0000 mg | SUBCUTANEOUS | 2 refills | Status: DC

## 2020-05-17 NOTE — Assessment & Plan Note (Addendum)
Uncontrolled cholesterol LDL 190 Suspect familial hyperlipidemia, fam history and appropriate diet/lifestyle Elevated ASCVD risk Failed Rosuvastatin and prior Atorvastatin due to myalgia, drug induced myopathy  Plan: 1. Start Praluent 75mg  injection q 2 weeks, new rx sent, anticipate will need PA. 2. Encourage improved lifestyle - low carb/cholesterol, reduce portion size, continue improving regular exercise Follow-up 4 month Lipid panel

## 2020-05-17 NOTE — Assessment & Plan Note (Signed)
Secondary to DM2 see A&P

## 2020-05-17 NOTE — Assessment & Plan Note (Signed)
See A&P DM

## 2020-05-17 NOTE — Assessment & Plan Note (Addendum)
Elevated Z2C to 8.5 Complications - CKD-III, other including hyperlipidemia - increases risk of future cardiovascular complications   Plan:  No change today - emphasize improving lifestyle for now. Future can consider adjusting Ozempic dose  1. Continue current therapy - Ozempic 1mg  weekly inj, Jardiance 25mg  daily 2. Encourage improved lifestyle - low carb, low sugar diet, reduce portion size, continue improving regular exercise 3. Check CBG on CGM 4. Negative microalbumin 04/2020. Not on acei/arb 5. Statin intolerance - will start PCSK9 inhib Praluent 6. UTD DM Foot / Eye Follow-up 4 months A1c

## 2020-05-17 NOTE — Progress Notes (Signed)
Subjective:    Patient ID: Holly Steele, female    DOB: 05-19-1956, 64 y.o.   MRN: [id number]  Holly Steele is a 64 y.o. female presenting on 05/17/2020 for Diabetes   HPI   CHRONIC DM, Type 2 / CKD III Hyperlipidemia in T2DM Last A1c 8.5 (prior 8.7) Attributed to lifestyle poor diet., she will work on improving diet. Reports history of low sugars in past then dx DM2. She did change lifestyle diet to Vegan CBGs - continuous glucose monitor, DexCom6  Meds: Ozempic 1mg  weekly inj, Jardiance 25mg  daily. Reports good compliance. Tolerating well w/o side-effects Not on ACEi/ARB. Urine albumin was negative 04/19/20 Lifestyle: - Diet (better low carb diet overall)  Denies hypoglycemia, polyuria, visual changes, numbness or tingling.  Vitamin Deficiency B12 Malabsorption Previously would receive Vitamin B12 vaccine. Last lab Vit B12 1556 (04/2020), elevated Taking oral Vitamin B12 1076mcg, now every other day. Vegan diet  Familial Hyperlipidemia / Fatty Liver History of chronic elevated cholesterol with complication of liver Last lipid panel with LDL 190. Failed Rosuvastatin 20mg  due to myalgias She admits more sensitive to medications  Osteopenia Previously bone mineral density DEXA 10/2019 Osteoporosis L spine, Osteopenia Hip On Alendronate 70mg  weekly On Calcium+Vitamin D Stopped Calcium Phosphate  Health Maintenance:  UTD COVID19 vaccine  Colon CA Screening: Last Colonoscopy done 10-12 years ago GI in [place], no results but she says it was normal, will request record. Currently asymptomatic. No known family history of colon CA. - Cologuard completed, 04/27/20 - negative result. Next due 2024.  UTD Pap Smear 05/09/20 negative for malignancy and negative HPV. Done by Dr Amalia Hailey at [facility]. Return yearly, pap every 3 years. Next due 2024.  Due mammogram, has order from GYN, she will call [facility] to schedule.   Depression screen [facility] 2/9 05/17/2020 05/05/2020 04/19/2020   Decreased Interest 0 0 0  Down, Depressed, Hopeless 0 0 0  PHQ - 2 Score 0 0 0  Altered sleeping 0 2 -  Tired, decreased energy 0 0 -  Change in appetite 0 0 -  Feeling bad or failure about yourself  0 0 -  Trouble concentrating 0 0 -  Moving slowly or fidgety/restless 0 0 -  Suicidal thoughts 0 0 -  PHQ-9 Score 0 2 -  Difficult doing work/chores Not difficult at all Not difficult at all -    Past Medical History:  Diagnosis Date  . Asthma    past  . Osteoporosis    Past Surgical History:  Procedure Laterality Date  . POLYPECTOMY     Social History   Socioeconomic History  . Marital status: Married    Spouse name: Not on file  . Number of children: Not on file  . Years of education: Not on file  . Highest education level: Not on file  Occupational History  . Not on file  Tobacco Use  . Smoking status: Never Smoker  . Smokeless tobacco: Never Used  Substance and Sexual Activity  . Alcohol use: Yes    Comment: Social   . Drug use: Never  . Sexual activity: Not Currently  Other Topics Concern  . Not on file  Social History Narrative  . Not on file   Social Determinants of Health   Financial Resource Strain:   . Difficulty of Paying Living Expenses:   Food Insecurity:   . Worried About Charity fundraiser in the Last Year:   . Coward in the Last Year:  Transportation Needs:   . Film/video editor (Medical):   Holly Steele Lack of Transportation (Non-Medical):   Physical Activity:   . Days of Exercise per Week:   . Minutes of Exercise per Session:   Stress:   . Feeling of Stress :   Social Connections:   . Frequency of Communication with Friends and Family:   . Frequency of Social Gatherings with Friends and Family:   . Attends Religious Services:   . Active Member of Clubs or Organizations:   . Attends Archivist Meetings:   Holly Steele Marital Status:   Intimate Partner Violence:   . Fear of Current or Ex-Partner:   . Emotionally Abused:   Holly Steele  Physically Abused:   . Sexually Abused:    Family History  Problem Relation Age of Onset  . Hypertension Mother   . Alcohol abuse Father   . Diabetes Father   . Diabetes Sister   . Diabetes Brother    Current Outpatient Medications on File Prior to Visit  Medication Sig  . albuterol (VENTOLIN HFA) 108 (90 Base) MCG/ACT inhaler Inhale 2 puffs into the lungs every 4 (four) hours as needed for wheezing or shortness of breath (cough).  Holly Steele alendronate (FOSAMAX) 70 MG tablet   . calcium-vitamin D (OSCAL WITH D) 500-200 MG-UNIT tablet Take 1 tablet by mouth.  . Continuous Blood Gluc Receiver ([place]) [place]   . Continuous Blood Gluc Sensor (DEXCOM G6 SENSOR) MISC SMARTSIG:1 Each Topical Every 10 Days  . Continuous Blood Gluc Transmit (DEXCOM G6 TRANSMITTER) MISC   . JARDIANCE 25 MG TABS tablet Take 25 mg by mouth daily.  . magnesium oxide (MAG-OX) 400 MG tablet Take 400 mg by mouth daily.  Holly Steele OZEMPIC, 1 MG/DOSE, 2 MG/1.5ML SOPN Inject 1 mg into the skin once a week.  . traZODone (DESYREL) 100 MG tablet Take 1 tablet (100 mg total) by mouth at bedtime.  . triamcinolone cream (KENALOG) 0.5 % Apply 1 application topically 2 (two) times daily. To affected areas, for up to 2 weeks.  . vitamin B-12 (CYANOCOBALAMIN) 1000 MCG tablet Take 1,000 mcg by mouth every other day.   No current facility-administered medications on file prior to visit.    Review of Systems Per HPI unless specifically indicated above      Objective:    BP 93/65   Pulse 78   Temp (!) 96.9 F (36.1 C) (Temporal)   Resp 16   Ht 5\' 5"  (1.651 m)   Wt 138 lb 6.4 oz (62.8 kg)   SpO2 100%   BMI 23.03 kg/m   Wt Readings from Last 3 Encounters:  05/17/20 138 lb 6.4 oz (62.8 kg)  05/09/20 142 lb 14.4 oz (64.8 kg)  05/05/20 140 lb (63.5 kg)    Physical Exam Vitals and nursing note reviewed.  Constitutional:      General: She is not in acute distress.    Appearance: She is well-developed. She is not  diaphoretic.     Comments: Well-appearing, comfortable, cooperative  HENT:     Head: Normocephalic and atraumatic.  Eyes:     General:        Right eye: No discharge.        Left eye: No discharge.     Conjunctiva/sclera: Conjunctivae normal.  Neck:     Thyroid: No thyromegaly.     Vascular: No carotid bruit.  Cardiovascular:     Rate and Rhythm: Normal rate and regular rhythm.     Heart  sounds: Normal heart sounds. No murmur heard.   Pulmonary:     Effort: Pulmonary effort is normal. No respiratory distress.     Breath sounds: Normal breath sounds. No wheezing or rales.  Musculoskeletal:        General: Normal range of motion.     Cervical back: Normal range of motion and neck supple.  Lymphadenopathy:     Cervical: No cervical adenopathy.  Skin:    General: Skin is warm and dry.     Findings: No erythema or rash.  Neurological:     Mental Status: She is alert and oriented to person, place, and time.  Psychiatric:        Behavior: Behavior normal.     Comments: Well groomed, good eye contact, normal speech and thoughts       Results for orders placed or performed in visit on 05/10/20  TSH  Result Value Ref Range   TSH 4.31 0.40 - 4.50 mIU/L  Hepatitis C antibody  Result Value Ref Range   Hepatitis C Ab NON-REACTIVE NON-REACTI   SIGNAL TO CUT-OFF 0.01 <1.00  Lipid panel  Result Value Ref Range   Cholesterol 283 (H) <200 mg/dL   HDL 50 > OR = 50 mg/dL   Triglycerides 231 (H) <150 mg/dL   LDL Cholesterol (Calc) 190 (H) mg/dL (calc)   Total CHOL/HDL Ratio 5.7 (H) <5.0 (calc)   Non-HDL Cholesterol (Calc) 233 (H) <130 mg/dL (calc)  COMPLETE METABOLIC PANEL WITH GFR  Result Value Ref Range   Glucose, Bld 166 (H) 65 - 99 mg/dL   BUN 30 (H) 7 - 25 mg/dL   Creat 1.11 (H) 0.50 - 0.99 mg/dL   GFR, Est Non African American 53 (L) > OR = 60 mL/min/1.84m2   GFR, Est African American 62 > OR = 60 mL/min/1.88m2   BUN/Creatinine Ratio 27 (H) 6 - 22 (calc)   Sodium 139 135 -  146 mmol/L   Potassium 3.7 3.5 - 5.3 mmol/L   Chloride 103 98 - 110 mmol/L   CO2 26 20 - 32 mmol/L   Calcium 10.0 8.6 - 10.4 mg/dL   Total Protein 7.2 6.1 - 8.1 g/dL   Albumin 4.5 3.6 - 5.1 g/dL   Globulin 2.7 1.9 - 3.7 g/dL (calc)   AG Ratio 1.7 1.0 - 2.5 (calc)   Total Bilirubin 0.6 0.2 - 1.2 mg/dL   Alkaline phosphatase (APISO) 63 37 - 153 U/L   AST 13 10 - 35 U/L   ALT 16 6 - 29 U/L  CBC with Differential/Platelet  Result Value Ref Range   WBC 7.8 3.8 - 10.8 Thousand/uL   RBC 5.02 3.80 - 5.10 Million/uL   Hemoglobin 14.7 11.7 - 15.5 g/dL   HCT 45.0 35 - 45 %   MCV 89.6 80.0 - 100.0 fL   MCH 29.3 27.0 - 33.0 pg   MCHC 32.7 32.0 - 36.0 g/dL   RDW 12.8 11.0 - 15.0 %   Platelets 280 140 - 400 Thousand/uL   MPV 10.8 7.5 - 12.5 fL   Neutro Abs 3,682 1,500 - 7,800 cells/uL   Lymphs Abs 3,268 850 - 3,900 cells/uL   Absolute Monocytes 601 200 - 950 cells/uL   Eosinophils Absolute 172 15 - 500 cells/uL   Basophils Absolute 78 0 - 200 cells/uL   Neutrophils Relative % 47.2 %   Total Lymphocyte 41.9 %   Monocytes Relative 7.7 %   Eosinophils Relative 2.2 %   Basophils Relative 1.0 %  Hemoglobin A1c  Result Value Ref Range   Hgb A1c MFr Bld 8.5 (H) <5.7 % of total Hgb   Mean Plasma Glucose 197 (calc)   eAG (mmol/L) 10.9 (calc)      Assessment & Plan:   Problem List Items Addressed This Visit    Type 2 diabetes mellitus with hyperglycemia, without long-term current use of insulin (HCC) - Primary    Elevated F0O to 8.5 Complications - CKD-III, other including hyperlipidemia - increases risk of future cardiovascular complications   Plan:  No change today - emphasize improving lifestyle for now. Future can consider adjusting Ozempic dose  1. Continue current therapy - Ozempic 1mg  weekly inj, Jardiance 25mg  daily 2. Encourage improved lifestyle - low carb, low sugar diet, reduce portion size, continue improving regular exercise 3. Check CBG on CGM 4. Negative microalbumin  04/2020. Not on acei/arb 5. Statin intolerance - will start PCSK9 inhib Praluent 6. UTD DM Foot / Eye Follow-up 4 months A1c       Hyperlipidemia associated with type 2 diabetes mellitus ([place])    Uncontrolled cholesterol LDL 190 Suspect familial hyperlipidemia, fam history and appropriate diet/lifestyle Elevated ASCVD risk Failed Rosuvastatin and prior Atorvastatin due to myalgia, drug induced myopathy  Plan: 1. Start Praluent 75mg  injection q 2 weeks, new rx sent, anticipate will need PA. 2. Encourage improved lifestyle - low carb/cholesterol, reduce portion size, continue improving regular exercise Follow-up 4 month Lipid panel       Relevant Medications   PRALUENT 75 MG/ML SOAJ   Familial hypercholesterolemia    See A&P Hyperlipidemia.      Relevant Medications   PRALUENT 75 MG/ML SOAJ   Drug-induced myopathy   Relevant Medications   PRALUENT 75 MG/ML SOAJ   Diabetic nephropathy associated with type 2 diabetes mellitus ([place])    See A&P DM      CKD (chronic kidney disease), stage III    Secondary to DM2 see A&P         Meds ordered this encounter  Medications  . PRALUENT 75 MG/ML SOAJ    Sig: Inject 75 mg into the skin every 14 (fourteen) days.    Dispense:  2.24 mL    Refill:  2     Follow up plan: Return in about 4 months (around 09/17/2020) for 4 month follow-up DM A1c, HLD new med.  Future labs BMET Lipid A1c 09/2020  Nobie Putnam, [facility] 05/17/2020, 8:15 AM

## 2020-05-17 NOTE — Patient Instructions (Addendum)
Thank you for coming to the office today.  For cholesterol - STOP Rosuvastatin. START Praluent injectable antibody for cholesterol once EVERY TWO WEEKS. This should have no side effects. We will try to get this approved. If needed we may have to switch to other statin if required first.  Chronic Kidney Disease is mild, and stable over past 1 year. No urgent need. Stay well hydrated. Avoid anti inflammatory (advil, ibuprofen, aleve)  ----------------------------- For Mammogram screening for breast cancer   Call the [place] below anytime to schedule your own appointment now that order has been placed.  [facility] [place], [place] [address] Phone: [phone]  DUE for FASTING BLOOD WORK (no food or drink after midnight before the lab appointment, only water or coffee without cream/sugar on the morning of)  SCHEDULE "Lab Only" visit in the morning at the clinic for lab draw in 4 MONTHS   - Make sure Lab Only appointment is at about 1 week before your next appointment, so that results will be available  For Lab Results, once available within 2-3 days of blood draw, you can can log in to MyChart online to view your results and a brief explanation. Also, we can discuss results at next follow-up visit.   Please schedule a Follow-up Appointment to: Return in about 4 months (around 09/17/2020) for 4 month follow-up DM A1c, HLD new med.  If you have any other questions or concerns, please feel free to call the office or send a message through [place]. You may also schedule an earlier appointment if necessary.  Additionally, you may be receiving a survey about your experience at our office within a few days to 1 week by e-mail or mail. We value your feedback.  Nobie Putnam, DO [place]

## 2020-05-17 NOTE — Assessment & Plan Note (Signed)
See A&P Hyperlipidemia. 

## 2020-06-08 ENCOUNTER — Telehealth: Payer: Self-pay | Admitting: Family Medicine

## 2020-06-08 NOTE — Telephone Encounter (Signed)
Called pharmacy to rerun the Rx since it was approved two weeks ago and now it is ready for her to pick up also left detail message for the patient.

## 2020-06-08 NOTE — Telephone Encounter (Signed)
Last ordered Praluent 05/17/20. I believe we have received an approval from the PA that was submitted. Do not have copy of that currently. We just received a new fax on 8/4 saying that it is not covered and requesting alternative.  I attempted to call patient to clarify if she has received the medicine and has started it and how she is doing etc, to make sure it was approved and covered for her now.  I left her a voicemail. She may respond on mychart or call back.  FYI if she calls back.  If we do not hear from her, next step would be to call the pharmacy to see if they filled the rx / coverage / and if she picked it up.  Thanks!  Nobie Putnam, DO [facility] 06/08/2020, 11:42 AM

## 2020-06-16 ENCOUNTER — Other Ambulatory Visit: Payer: Self-pay

## 2020-06-16 ENCOUNTER — Ambulatory Visit
Admission: RE | Admit: 2020-06-16 | Discharge: 2020-06-16 | Disposition: A | Discharge status: Home / Self Care | Payer: 59 | Source: Ambulatory Visit | Attending: Obstetrics and Gynecology | Admitting: Obstetrics and Gynecology

## 2020-06-16 DIAGNOSIS — Z1231 Encounter for screening mammogram for malignant neoplasm of breast: Secondary | ICD-10-CM

## 2020-06-16 IMAGING — MG DIGITAL SCREENING BILAT W/ TOMO W/ CAD
8 series · 8 of 24 positions shown · non-contrast
Comparison: None.

CLINICAL DATA: Screening.

EXAM:
DIGITAL SCREENING BILATERAL MAMMOGRAM WITH TOMO AND CAD

[R CC synth-2D]
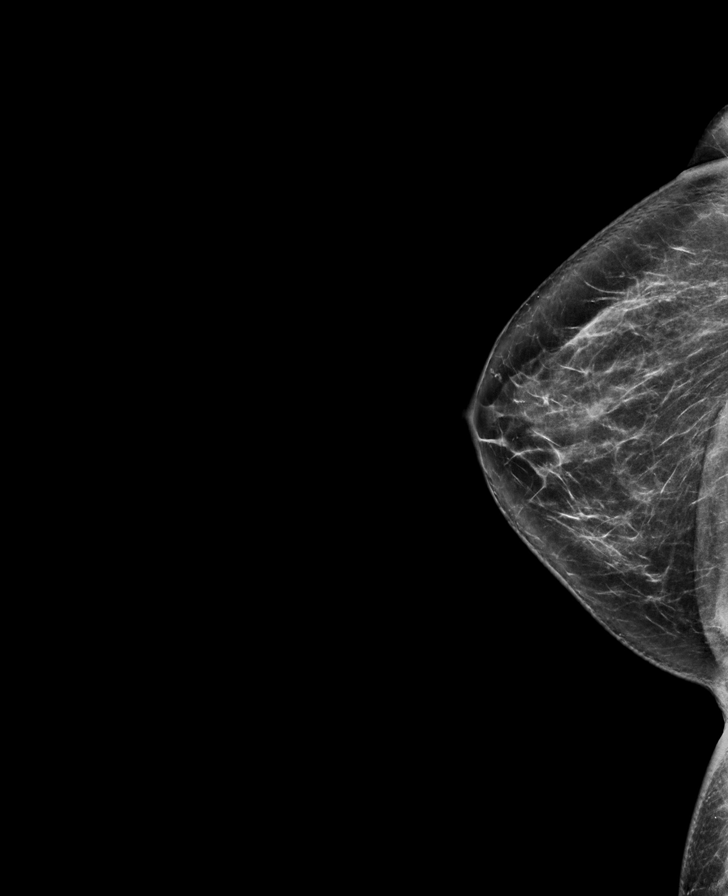

[R MLO synth-2D]
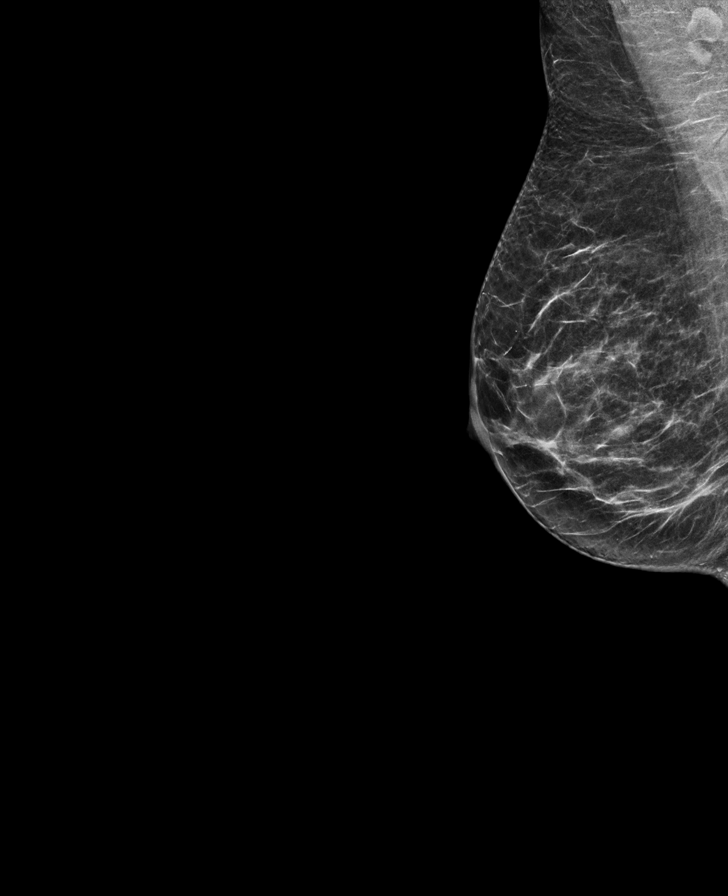

[L MLO synth-2D]
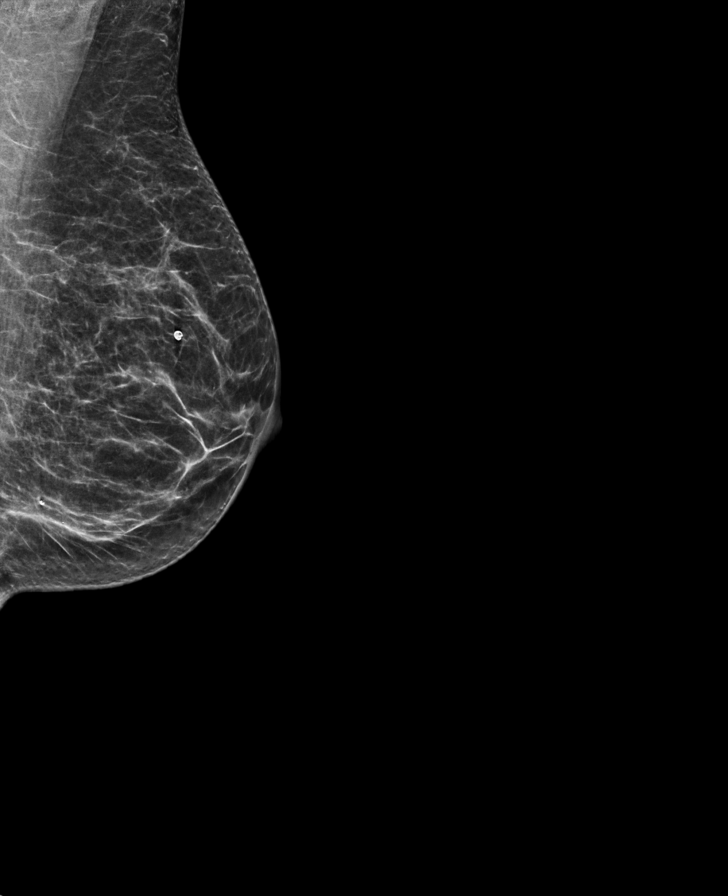

[L CC synth-2D]
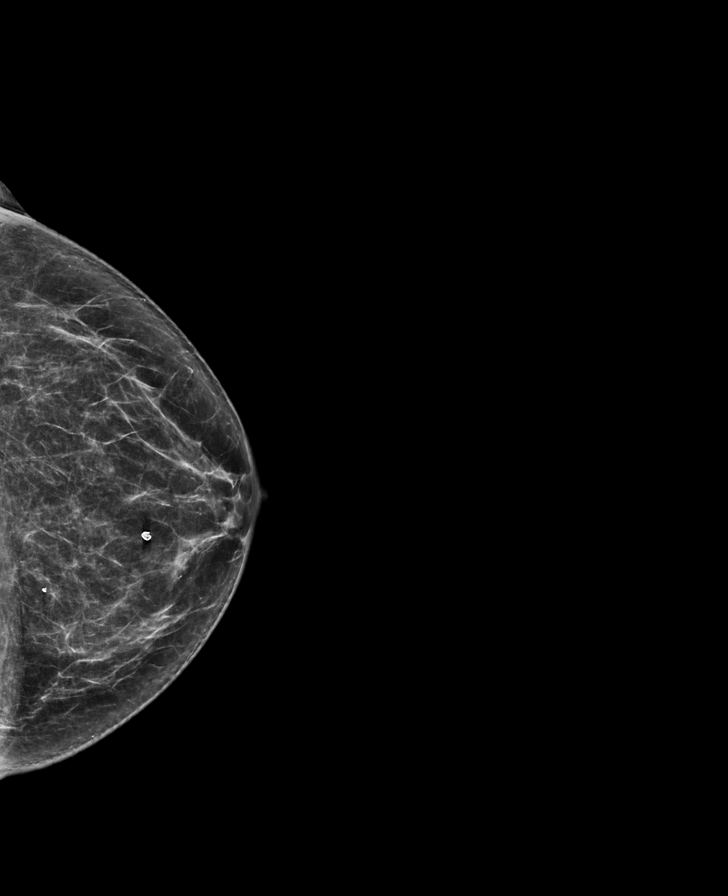

[R CC tomo · tomo slice 36/71.0]
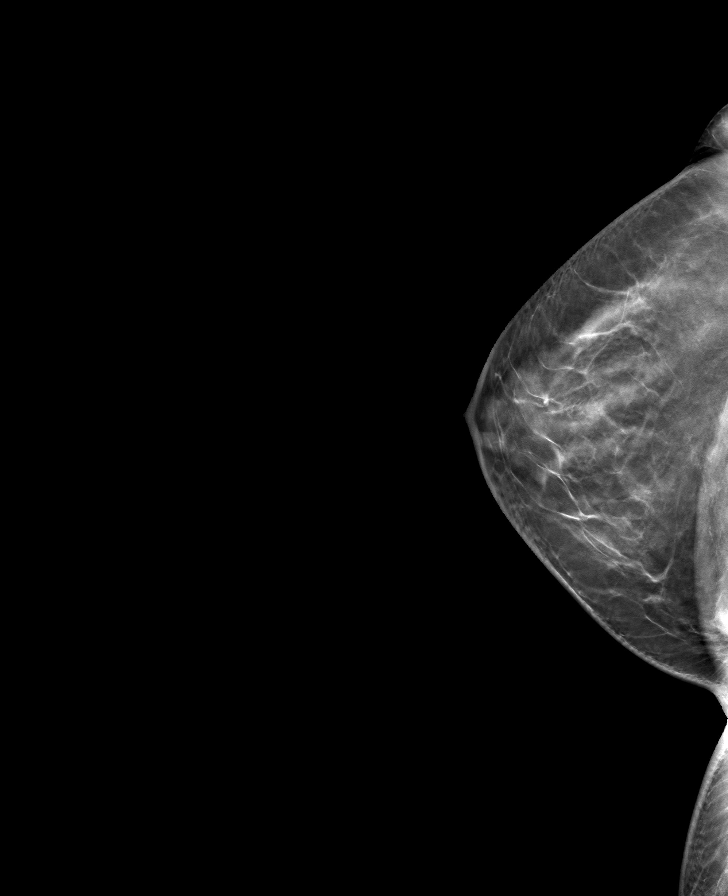

[L CC tomo · tomo slice 34/67.0]
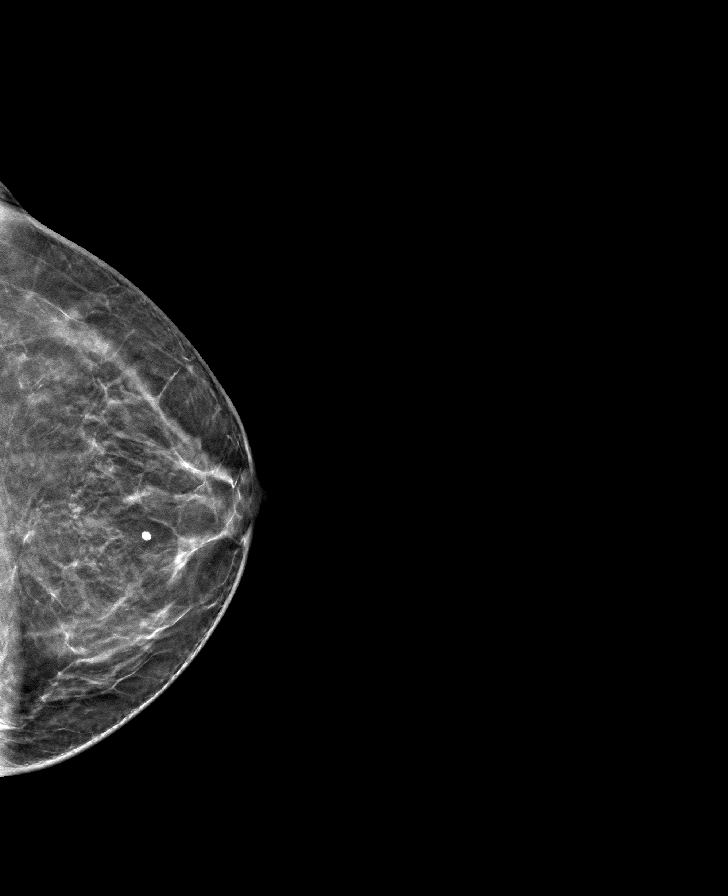

[R MLO tomo · tomo slice 30/59.0]
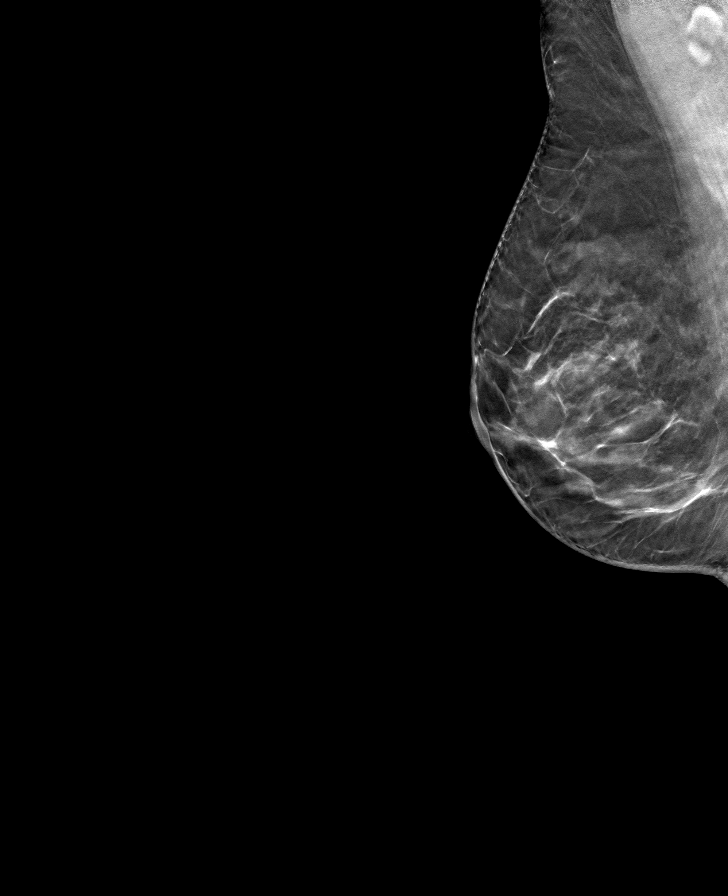

[L MLO tomo · tomo slice 29/58.0]
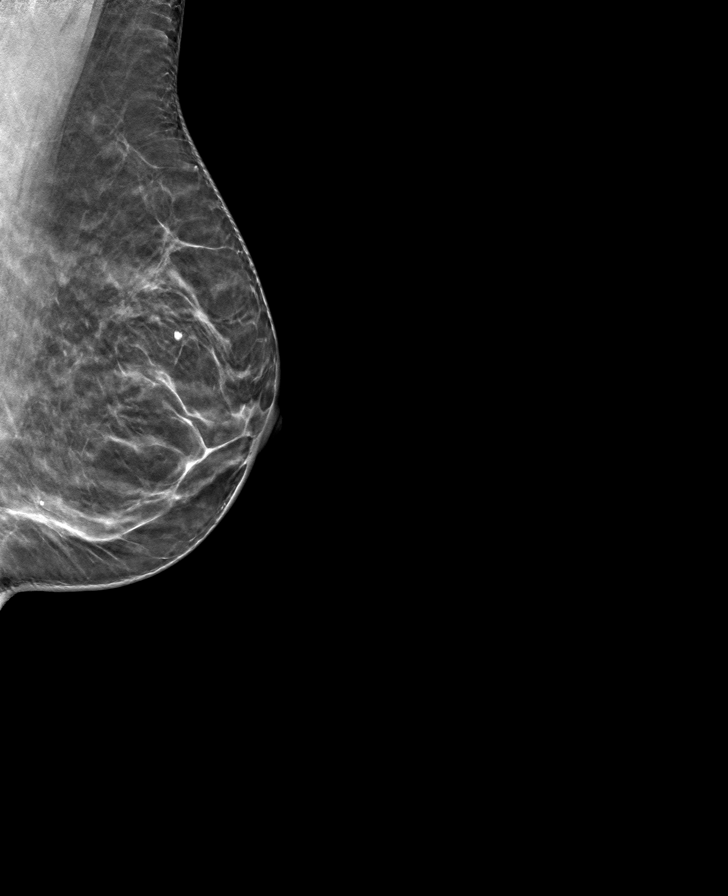

[8 of 24 positions shown; findings below may reference images not displayed]

ACR Breast Density Category b: There are scattered areas of
fibroglandular density.
FINDINGS: There are no findings suspicious for malignancy. Images were
processed with CAD.
IMPRESSION: No mammographic evidence of malignancy. A result letter of this
screening mammogram will be mailed directly to the patient.

RECOMMENDATION:
Screening mammogram in one year. (Code:[1T])

BI-RADS CATEGORY  1: Negative.

## 2020-08-14 ENCOUNTER — Other Ambulatory Visit: Payer: Self-pay

## 2020-08-14 ENCOUNTER — Encounter: Payer: Self-pay | Admitting: Dermatology

## 2020-08-14 ENCOUNTER — Ambulatory Visit: Payer: No Typology Code available for payment source | Admitting: Dermatology

## 2020-08-14 DIAGNOSIS — L237 Allergic contact dermatitis due to plants, except food: Secondary | ICD-10-CM | POA: Diagnosis not present

## 2020-08-14 DIAGNOSIS — L247 Irritant contact dermatitis due to plants, except food: Secondary | ICD-10-CM

## 2020-08-14 DIAGNOSIS — L814 Other melanin hyperpigmentation: Secondary | ICD-10-CM

## 2020-08-14 DIAGNOSIS — T22011A Burn of unspecified degree of right forearm, initial encounter: Secondary | ICD-10-CM

## 2020-08-14 DIAGNOSIS — D485 Neoplasm of uncertain behavior of skin: Secondary | ICD-10-CM

## 2020-08-14 DIAGNOSIS — X150XXA Contact with hot stove (kitchen), initial encounter: Secondary | ICD-10-CM

## 2020-08-14 DIAGNOSIS — L821 Other seborrheic keratosis: Secondary | ICD-10-CM

## 2020-08-14 DIAGNOSIS — Y929 Unspecified place or not applicable: Secondary | ICD-10-CM

## 2020-08-14 DIAGNOSIS — T300 Burn of unspecified body region, unspecified degree: Secondary | ICD-10-CM

## 2020-08-14 DIAGNOSIS — L578 Other skin changes due to chronic exposure to nonionizing radiation: Secondary | ICD-10-CM

## 2020-08-14 DIAGNOSIS — Z1283 Encounter for screening for malignant neoplasm of skin: Secondary | ICD-10-CM

## 2020-08-14 DIAGNOSIS — D229 Melanocytic nevi, unspecified: Secondary | ICD-10-CM

## 2020-08-14 DIAGNOSIS — D1800 Hemangioma unspecified site: Secondary | ICD-10-CM

## 2020-08-14 HISTORY — DX: Melanocytic nevi, unspecified: D22.9

## 2020-08-14 MED ORDER — MOMETASONE FUROATE 0.1 % EX CREA: TOPICAL_CREAM | CUTANEOUS | 0 refills | Status: DC

## 2020-08-14 NOTE — Patient Instructions (Signed)
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
## 2020-08-17 ENCOUNTER — Telehealth: Payer: Self-pay

## 2020-08-17 NOTE — Telephone Encounter (Signed)
Patient informed of pathology results 

## 2020-08-24 ENCOUNTER — Other Ambulatory Visit: Payer: Self-pay | Admitting: Family Medicine

## 2020-08-24 DIAGNOSIS — E1169 Type 2 diabetes mellitus with other specified complication: Secondary | ICD-10-CM

## 2020-08-24 DIAGNOSIS — E7801 Familial hypercholesterolemia: Secondary | ICD-10-CM

## 2020-08-24 DIAGNOSIS — G720 Drug-induced myopathy: Secondary | ICD-10-CM

## 2020-08-24 DIAGNOSIS — E785 Hyperlipidemia, unspecified: Secondary | ICD-10-CM

## 2020-09-11 ENCOUNTER — Other Ambulatory Visit: Payer: Self-pay | Admitting: *Deleted

## 2020-09-11 DIAGNOSIS — E7801 Familial hypercholesterolemia: Secondary | ICD-10-CM

## 2020-09-11 DIAGNOSIS — E1121 Type 2 diabetes mellitus with diabetic nephropathy: Secondary | ICD-10-CM

## 2020-09-11 DIAGNOSIS — E1169 Type 2 diabetes mellitus with other specified complication: Secondary | ICD-10-CM

## 2020-09-11 DIAGNOSIS — E1165 Type 2 diabetes mellitus with hyperglycemia: Secondary | ICD-10-CM

## 2020-09-12 ENCOUNTER — Other Ambulatory Visit: Payer: No Typology Code available for payment source

## 2020-09-13 LAB — LIPID PANEL
Cholesterol: 156 mg/dL (ref <200)
HDL: 47 mg/dL — ABNORMAL LOW (ref >=50)
LDL Cholesterol (Calc): 86 mg/dL (calc)
Non-HDL Cholesterol (Calc): 109 mg/dL (calc) (ref <130)
Total CHOL/HDL Ratio: 3.3 (calc) (ref <5.0)
Triglycerides: 125 mg/dL (ref <150)

## 2020-09-13 LAB — BASIC METABOLIC PANEL WITH GFR
BUN/Creatinine Ratio: 22 (calc) (ref 6–22)
BUN: 26 mg/dL — ABNORMAL HIGH (ref 7–25)
CO2: 28 mmol/L (ref 20–32)
Calcium: 10.1 mg/dL (ref 8.6–10.4)
Chloride: 102 mmol/L (ref 98–110)
Creat: 1.17 mg/dL — ABNORMAL HIGH (ref 0.50–0.99)
GFR, Est African American: 58 mL/min/{1.73_m2} — ABNORMAL LOW (ref >=60)
GFR, Est Non African American: 50 mL/min/{1.73_m2} — ABNORMAL LOW (ref >=60)
Glucose, Bld: 162 mg/dL — ABNORMAL HIGH (ref 65–99)
Potassium: 4.3 mmol/L (ref 3.5–5.3)
Sodium: 139 mmol/L (ref 135–146)

## 2020-09-13 LAB — HEMOGLOBIN A1C
Hgb A1c MFr Bld: 8.2 % of total Hgb — ABNORMAL HIGH (ref <5.7)
Mean Plasma Glucose: 189 (calc)
eAG (mmol/L): 10.4 (calc)

## 2020-09-19 ENCOUNTER — Ambulatory Visit (INDEPENDENT_AMBULATORY_CARE_PROVIDER_SITE_OTHER): Payer: No Typology Code available for payment source | Admitting: Family Medicine

## 2020-09-19 ENCOUNTER — Other Ambulatory Visit: Payer: Self-pay

## 2020-09-19 ENCOUNTER — Encounter: Payer: Self-pay | Admitting: Family Medicine

## 2020-09-19 VITALS — BP 99/63 | HR 79 | Temp 96.8°F | Resp 16 | Ht 65.0 in | Wt 137.0 lb

## 2020-09-19 DIAGNOSIS — E1169 Type 2 diabetes mellitus with other specified complication: Secondary | ICD-10-CM

## 2020-09-19 DIAGNOSIS — Z23 Encounter for immunization: Secondary | ICD-10-CM | POA: Diagnosis not present

## 2020-09-19 DIAGNOSIS — E1165 Type 2 diabetes mellitus with hyperglycemia: Secondary | ICD-10-CM | POA: Diagnosis not present

## 2020-09-19 DIAGNOSIS — M8580 Other specified disorders of bone density and structure, unspecified site: Secondary | ICD-10-CM

## 2020-09-19 DIAGNOSIS — N1831 Chronic kidney disease, stage 3a: Secondary | ICD-10-CM

## 2020-09-19 DIAGNOSIS — E785 Hyperlipidemia, unspecified: Secondary | ICD-10-CM

## 2020-09-19 DIAGNOSIS — E7801 Familial hypercholesterolemia: Secondary | ICD-10-CM

## 2020-09-19 DIAGNOSIS — E1121 Type 2 diabetes mellitus with diabetic nephropathy: Secondary | ICD-10-CM | POA: Diagnosis not present

## 2020-09-19 MED ORDER — OZEMPIC (1 MG/DOSE) 4 MG/3ML ~~LOC~~ SOPN: 1.0000 mg | PEN_INJECTOR | SUBCUTANEOUS | 3 refills | Status: DC

## 2020-09-19 MED ORDER — DEXCOM G6 TRANSMITTER MISC: 3 refills | Status: DC

## 2020-09-19 MED ORDER — JARDIANCE 25 MG PO TABS: 25.0000 mg | ORAL_TABLET | Freq: Every day | ORAL | 3 refills | Status: DC

## 2020-09-19 MED ORDER — ALENDRONATE SODIUM 70 MG PO TABS: 70.0000 mg | ORAL_TABLET | ORAL | 3 refills | Status: DC

## 2020-09-19 MED ORDER — DEXCOM G6 SENSOR MISC: 3 refills | Status: DC

## 2020-09-19 NOTE — Patient Instructions (Addendum)
Thank you for coming to the office today.  Flu shot today ---------------------------------------------------------  [place] local [facility] - Communicate directly with Hospitalist doctors with [facility] but do not have admitting privileges  [place] [facility]  Other local hospital - [facility]  ----------------------------------------------------------  [place] can get booster dose at [facility] - check their websites or call to schedule in advance. Appointment is required.  [place] Vaccine Information  [email or web address]  Appointments are required. To register for your free vaccination appointment, click the link on the date on the calendar located on the website or call [phone].   Please schedule a Follow-up Appointment to: Return in about 3 months (around 12/20/2020) for 3 month DM A1c, HLD.  If you have any other questions or concerns, please feel free to call the office or send a message through [place]. You may also schedule an earlier appointment if necessary.  Additionally, you may be receiving a survey about your experience at our office within a few days to 1 week by e-mail or mail. We value your feedback.  Nobie Putnam, DO [place]

## 2020-09-19 NOTE — Assessment & Plan Note (Signed)
Dramatic improvement on PCSK9 inhibitor Praluent Repeat lipids now with LDL down to 86 (previous 190) Familial hyperlipidemia, fam history and appropriate diet/lifestyle Elevated ASCVD risk Failed Rosuvastatin and prior Atorvastatin due to myalgia, drug induced myopathy  Plan: 1. Continue Praluent 75mg  injection q 2 weeks 2. Encourage improved lifestyle - low carb/cholesterol, reduce portion size, continue improving regular exercise

## 2020-09-19 NOTE — Progress Notes (Signed)
Subjective:    Patient ID: Holly Steele, female    DOB: 26-Jun-1956, 64 y.o.   MRN: [id number]  Holly Steele is a 64 y.o. female presenting on 09/19/2020 for Diabetes   HPI   CHRONIC DM, Type 2 / CKD III Hyperlipidemia in T2DM A1c down to 8.2, previously 8.5 to 8.7 - improving overall Attributed to lifestyle poor diet., she will work on improving diet. Reportshistory of low sugars in past then dx DM2. She did change lifestyle diet to Vegan CBGs - continuous glucose monitor, DexCom6- refill 9 sensors (1 box 3 sensor x 3 = 90 day), 1 transmitter - previously covered by insurance Meds:Ozempic 1mg  weekly inj, Jardiance 25mg  daily - refill meds Reports good compliance. Tolerating well w/o side-effects Not on ACEi/ARB. Urine albumin was negative 04/19/20 Lifestyle: - Diet (better low carb diet overall)  - Now going to gym more and walking Denies hypoglycemia, polyuria, visual changes, numbness or tingling.  Familial Hyperlipidemia / Fatty Liver History of chronic elevated cholesterol with complication of liver Last lipid panel with LDL 190 previously 05/2020 - she was started on Praluent injection, failed statins due to myalgia (Rosuva 20) New lab results since 09/12/20 - major improvement total cholesterol, LDL from 190 down to 86 on PCSK9 inhibitor She is using Praluent injection 75mg  every 14 days (2 weeks) with great success - tolerating well, easy to use. She has joined a gym and walking more  Osteopenia Previously bone mineral density DEXA 10/2019 Osteoporosis L spine, Osteopenia Hip On Alendronate 70mg  weekly due for refill On Calcium+Vitamin D Stopped Calcium Phosphate  Health Maintenance:  UTD COVID19 vaccine last dose 2nd Moderna 01/08/20 - plan on booster when ready.  Due for Flu Shot, will receive today   Colon CA Screening: Last Colonoscopydone 10-12 years ago GI in [place], no results but she says it was normal, will request record.Currently  asymptomatic. No known family history of colon CA. - Cologuard completed, 04/27/20 - negative result. Next due 2024.  UTD Pap Smear 05/09/20 negative for malignancy and negative HPV. Done by Dr Amalia Hailey at [facility]. Return yearly, pap every 3 years. Next due 2024.  Mammogram done 06/16/20. Negative.  Depression screen [facility] 2/9 09/19/2020 05/17/2020 05/05/2020  Decreased Interest 0 0 0  Down, Depressed, Hopeless 0 0 0  PHQ - 2 Score 0 0 0  Altered sleeping - 0 2  Tired, decreased energy - 0 0  Change in appetite - 0 0  Feeling bad or failure about yourself  - 0 0  Trouble concentrating - 0 0  Moving slowly or fidgety/restless - 0 0  Suicidal thoughts - 0 0  PHQ-9 Score - 0 2  Difficult doing work/chores - Not difficult at all Not difficult at all    Social History   Tobacco Use  . Smoking status: Never Smoker  . Smokeless tobacco: Never Used  Substance Use Topics  . Alcohol use: Yes    Comment: Social   . Drug use: Never    Review of Systems Per HPI unless specifically indicated above     Objective:    BP 99/63   Pulse 79   Temp (!) 96.8 F (36 C) (Temporal)   Resp 16   Ht 5\' 5"  (1.651 m)   Wt 137 lb (62.1 kg)   SpO2 100%   BMI 22.80 kg/m   Wt Readings from Last 3 Encounters:  09/19/20 137 lb (62.1 kg)  05/17/20 138 lb 6.4 oz (62.8 kg)  05/09/20 142  lb 14.4 oz (64.8 kg)    Physical Exam Vitals and nursing note reviewed.  Constitutional:      General: She is not in acute distress.    Appearance: She is well-developed. She is not diaphoretic.     Comments: Well-appearing, comfortable, cooperative  HENT:     Head: Normocephalic and atraumatic.  Eyes:     General:        Right eye: No discharge.        Left eye: No discharge.     Conjunctiva/sclera: Conjunctivae normal.  Neck:     Thyroid: No thyromegaly.  Cardiovascular:     Rate and Rhythm: Normal rate and regular rhythm.     Heart sounds: Normal heart sounds. No murmur heard.   Pulmonary:     Effort:  Pulmonary effort is normal. No respiratory distress.     Breath sounds: Normal breath sounds. No wheezing or rales.  Musculoskeletal:        General: Normal range of motion.     Cervical back: Normal range of motion and neck supple.  Lymphadenopathy:     Cervical: No cervical adenopathy.  Skin:    General: Skin is warm and dry.     Findings: No erythema or rash.  Neurological:     Mental Status: She is alert and oriented to person, place, and time.  Psychiatric:        Behavior: Behavior normal.     Comments: Well groomed, good eye contact, normal speech and thoughts        Results for orders placed or performed in visit on 68/34/19  BASIC METABOLIC PANEL WITH GFR  Result Value Ref Range   Glucose, Bld 162 (H) 65 - 99 mg/dL   BUN 26 (H) 7 - 25 mg/dL   Creat 1.17 (H) 0.50 - 0.99 mg/dL   GFR, Est Non African American 50 (L) > OR = 60 mL/min/1.62m2   GFR, Est African American 58 (L) > OR = 60 mL/min/1.43m2   BUN/Creatinine Ratio 22 6 - 22 (calc)   Sodium 139 135 - 146 mmol/L   Potassium 4.3 3.5 - 5.3 mmol/L   Chloride 102 98 - 110 mmol/L   CO2 28 20 - 32 mmol/L   Calcium 10.1 8.6 - 10.4 mg/dL  Lipid panel  Result Value Ref Range   Cholesterol 156 <200 mg/dL   HDL 47 (L) > OR = 50 mg/dL   Triglycerides 125 <150 mg/dL   LDL Cholesterol (Calc) 86 mg/dL (calc)   Total CHOL/HDL Ratio 3.3 <5.0 (calc)   Non-HDL Cholesterol (Calc) 109 <130 mg/dL (calc)  Hemoglobin A1c  Result Value Ref Range   Hgb A1c MFr Bld 8.2 (H) <5.7 % of total Hgb   Mean Plasma Glucose 189 (calc)   eAG (mmol/L) 10.4 (calc)      Assessment & Plan:   Problem List Items Addressed This Visit    Type 2 diabetes mellitus with hyperglycemia, without long-term current use of insulin (HCC) - Primary    Improved Q2I to 8.2 now Complications - CKD-III, other including hyperlipidemia - increases risk of future cardiovascular complications   Plan:  1. Continue current therapy - Ozempic 1mg  weekly inj, Jardiance  25mg  daily 2. Encourage improved lifestyle - low carb, low sugar diet, reduce portion size, continue improving regular exercise 3. Check CBG on CGM - re order sensor, transmitter 4. Negative microalbumin 04/2020. Not on acei/arb - next due 04/2021 5. Statin intolerance - CONTINUE PCSK9 inhib Praluent 6. UTD  DM Foot / Eye      Relevant Medications   JARDIANCE 25 MG TABS tablet   OZEMPIC, 1 MG/DOSE, 4 MG/3ML SOPN   Continuous Blood Gluc Transmit (DEXCOM G6 TRANSMITTER) MISC   Continuous Blood Gluc Sensor (DEXCOM G6 SENSOR) MISC   Hyperlipidemia associated with type 2 diabetes mellitus (HCC)    Dramatic improvement on PCSK9 inhibitor Praluent Repeat lipids now with LDL down to 86 (previous 190) Familial hyperlipidemia, fam history and appropriate diet/lifestyle Elevated ASCVD risk Failed Rosuvastatin and prior Atorvastatin due to myalgia, drug induced myopathy  Plan: 1. Continue Praluent 75mg  injection q 2 weeks 2. Encourage improved lifestyle - low carb/cholesterol, reduce portion size, continue improving regular exercise      Relevant Medications   JARDIANCE 25 MG TABS tablet   OZEMPIC, 1 MG/DOSE, 4 MG/3ML SOPN   Familial hypercholesterolemia    See A&P Hyperlipidemia.      Diabetic nephropathy associated with type 2 diabetes mellitus ([place])    See A&P DM Stable creatinine CKD-III      Relevant Medications   JARDIANCE 25 MG TABS tablet   OZEMPIC, 1 MG/DOSE, 4 MG/3ML SOPN   CKD (chronic kidney disease), stage III ([place])    Secondary to DM2 see A&P Last urine microalbumin negative. Not on ACEi ARB      Relevant Medications   JARDIANCE 25 MG TABS tablet    Other Visit Diagnoses    Needs flu shot       Relevant Orders   Flu Vaccine QUAD 36+ mos IM (Completed)   Osteopenia, unspecified location       Relevant Medications   alendronate (FOSAMAX) 70 MG tablet        Meds ordered this encounter  Medications  . alendronate (FOSAMAX) 70 MG tablet    Sig: Take 1 tablet  (70 mg total) by mouth once a week.    Dispense:  12 tablet    Refill:  3  . JARDIANCE 25 MG TABS tablet    Sig: Take 1 tablet (25 mg total) by mouth daily.    Dispense:  90 tablet    Refill:  3  . OZEMPIC, 1 MG/DOSE, 4 MG/3ML SOPN    Sig: Inject 1 mg into the skin once a week.    Dispense:  9 mL    Refill:  3  . Continuous Blood Gluc Transmit (DEXCOM G6 TRANSMITTER) MISC    Sig: Use every 90 days to check blood sugar    Dispense:  1 each    Refill:  3    1 transmitter Dexcom G6, E11.65  . Continuous Blood Gluc Sensor (DEXCOM G6 SENSOR) MISC    Sig: Use sensor to check blood sugar every 2 weeks. E11.65    Dispense:  9 each    Refill:  3    90 day supply, 3 boxes with 3 each for 9 sensors, E11.65     Follow up plan: Return in about 3 months (around 12/20/2020) for 3 month DM A1c, HLD.   Nobie Putnam, DO [facility] 09/19/2020, 8:16 AM

## 2020-09-19 NOTE — Assessment & Plan Note (Signed)
Improved F3K to 8.2 now Complications - CKD-III, other including hyperlipidemia - increases risk of future cardiovascular complications   Plan:  1. Continue current therapy - Ozempic 1mg  weekly inj, Jardiance 25mg  daily 2. Encourage improved lifestyle - low carb, low sugar diet, reduce portion size, continue improving regular exercise 3. Check CBG on CGM - re order sensor, transmitter 4. Negative microalbumin 04/2020. Not on acei/arb - next due 04/2021 5. Statin intolerance - CONTINUE PCSK9 inhib Praluent 6. UTD DM Foot / Eye

## 2020-09-19 NOTE — Assessment & Plan Note (Signed)
See A&P DM Stable creatinine CKD-III

## 2020-09-19 NOTE — Assessment & Plan Note (Addendum)
Secondary to DM2 see A&P Last urine microalbumin negative. Not on ACEi ARB 

## 2020-09-19 NOTE — Assessment & Plan Note (Signed)
See A&P Hyperlipidemia.

## 2020-11-17 ENCOUNTER — Other Ambulatory Visit: Payer: Self-pay | Admitting: Family Medicine

## 2020-11-17 DIAGNOSIS — E7801 Familial hypercholesterolemia: Secondary | ICD-10-CM

## 2020-11-17 DIAGNOSIS — E1169 Type 2 diabetes mellitus with other specified complication: Secondary | ICD-10-CM

## 2020-11-17 DIAGNOSIS — G720 Drug-induced myopathy: Secondary | ICD-10-CM

## 2020-12-20 ENCOUNTER — Encounter: Payer: Self-pay | Admitting: Family Medicine

## 2020-12-20 ENCOUNTER — Other Ambulatory Visit: Payer: Self-pay | Admitting: Family Medicine

## 2020-12-20 ENCOUNTER — Other Ambulatory Visit: Payer: Self-pay

## 2020-12-20 ENCOUNTER — Ambulatory Visit (INDEPENDENT_AMBULATORY_CARE_PROVIDER_SITE_OTHER): Payer: No Typology Code available for payment source | Admitting: Family Medicine

## 2020-12-20 VITALS — BP 93/57 | HR 72 | Temp 97.1°F | Resp 18 | Ht 65.0 in | Wt 131.2 lb

## 2020-12-20 DIAGNOSIS — E1121 Type 2 diabetes mellitus with diabetic nephropathy: Secondary | ICD-10-CM

## 2020-12-20 DIAGNOSIS — M25532 Pain in left wrist: Secondary | ICD-10-CM | POA: Diagnosis not present

## 2020-12-20 DIAGNOSIS — E7801 Familial hypercholesterolemia: Secondary | ICD-10-CM

## 2020-12-20 DIAGNOSIS — E785 Hyperlipidemia, unspecified: Secondary | ICD-10-CM

## 2020-12-20 DIAGNOSIS — E1169 Type 2 diabetes mellitus with other specified complication: Secondary | ICD-10-CM

## 2020-12-20 DIAGNOSIS — Z0000 Encounter for general adult medical examination without abnormal findings: Secondary | ICD-10-CM

## 2020-12-20 LAB — POCT GLYCOSYLATED HEMOGLOBIN (HGB A1C): Hemoglobin A1C: 6.4 % — AB (ref 4.0–5.6)

## 2020-12-20 MED ORDER — ONETOUCH VERIO VI STRP: ORAL_STRIP | 3 refills | Status: DC

## 2020-12-20 NOTE — Patient Instructions (Addendum)
Thank you for coming to the office today.  [facility] Ver[facility] test strips [facility]  Recent Labs    05/10/20 1009 09/12/20 0820 12/20/20 0857  HGBA1C 8.5* 8.2* 6.4*   Keep up the great work overall!  Future we can adjust Jardiance, most likely may lower dosage in future if indicated.  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 - This is a type of tendonitis involving the tendons from the thumb into the wrist, it is a very common spot for inflammation and pain, usually caused by repetitive activities (lifting, writing, typing, carrying, worse with heavier objective or more repetitive strain) - Once it is flared up it can continue to persist for days to weeks due to inflammation, and mostly importantly needs rest and time to heal  - It is safe to take Tylenol Ext Str 500mg  tabs - take 1 to 2 (max dose 1000mg ) every 6 hours as needed for breakthrough pain, max 24 hour daily dose is 6 to 8 tablets or 4000mg  - Wrist splint will provide support to the tendons and reduce strain - Purchase a THUMB SPICA SPLINT (to limit thumb and wrist flexion) - REST is extremely important, the goal is to AVOID re-injury, try to modify activities - Also may try ice packs if swelling, or topical icy hot muscle rub can also help ease worsening pain temporarily  If you get significant worsening pain, weakness in your hand (not due to pain), numbness, tingling, burning, more constant without activity, then follow-up sooner as we may need to consider stronger anti-inflammatory with prednisone, or referral for injection.   DUE for FASTING BLOOD WORK (no food or drink after midnight before the lab appointment, only water or coffee without cream/sugar on the morning of)  SCHEDULE "Lab Only" visit in the morning at the clinic for lab draw in 5 MONTHS   - Make  sure Lab Only appointment is at about 1 week before your next appointment, so that results will be available  For Lab Results, once available within 2-3 days of blood draw, you can can log in to MyChart online to view your results and a brief explanation. Also, we can discuss results at next follow-up visit.   Please schedule a Follow-up Appointment to: Return in about 5 months (around 05/19/2021) for 5 month fasting lab only then 1 week later Annual Physical.  If you have any other questions or concerns, please feel free to call the office or send a message through [place]. You may also schedule an earlier appointment if necessary.  Additionally, you may be receiving a survey about your experience at our office within a few days to 1 week by e-mail or mail. We value your feedback.  Holly Putnam, DO [place]

## 2020-12-20 NOTE — Progress Notes (Signed)
Subjective:    Patient ID: Holly Steele, female    DOB: 06/01/1956, 65 y.o.   MRN: [id number]  Hildred Pharo is a 65 y.o. female presenting on 12/20/2020 for Diabetes   HPI  CHRONIC DM, Type 2/ CKD III Hyperlipidemia in T2DM Today due for POC A1c, previously 8 range. Significant improvement to her lifestyle, primarily diet changes with Low Carb (not Keto or Vegan) reduced PO intake and limiting problematic foods. She is still keeping up with lifestyle with exercise regimen, see below. CBGs - continuous glucose monitor, DexCom6- refill 9 sensors (1 box 3 sensor x 3 = 90 day), 1 transmitter - previously covered by insurance Meds:Ozempic 1mg  weekly inj, Jardiance 25mg  daily Reports good compliance. Tolerating well w/o side-effects Not on ACEi/ARB. Urine albumin was negative 04/19/20 Lifestyle: - Diet Low Carb Diet, see above - Now going to gym more and walking Denies hypoglycemia, polyuria, visual changes, numbness or tingling.  FamilialHyperlipidemia / Fatty Liver History of chronic elevated cholesterol with complication of liver Last lipid panel with LDL 190 previously 05/2020 - she was started on Praluent injection, failed statins due to myalgia ([place] 20) Last lab results since 09/12/20 - major improvement total cholesterol, LDL from 190 down to 86 on PCSK9 inhibitor She is using Praluent injection 75mg  every 14 days (2 weeks) with great success - tolerating well, easy to use.  Left Wrist Pain, some bilateral Reports Left wrist issue with pain, describes frequent repetitive work on computer, says it causes her left wrist to be weaker at times and she uses other hand to help lift heavier objects. Some pain radial side of wrist if lifting.  Left Foot Pain/Callus Reports localized area left forefoot 2nd and 3rd toes, with area she feels deeper in, no dry skin thick callus.   Health Maintenance:  UTD COVID Vaccine / Flu Shot.  Colon CA Screening: Cologuard completed,  04/27/20 - negative result. Next due 2024.  UTD Pap Smear 05/09/20 negative. Next due 2024.  Mammogram done 06/16/20. Negative.    Depression screen [facility] 2/9 09/19/2020 05/17/2020 05/05/2020  Decreased Interest 0 0 0  Down, Depressed, Hopeless 0 0 0  PHQ - 2 Score 0 0 0  Altered sleeping - 0 2  Tired, decreased energy - 0 0  Change in appetite - 0 0  Feeling bad or failure about yourself  - 0 0  Trouble concentrating - 0 0  Moving slowly or fidgety/restless - 0 0  Suicidal thoughts - 0 0  PHQ-9 Score - 0 2  Difficult doing work/chores - Not difficult at all Not difficult at all    Social History   Tobacco Use  . Smoking status: Never Smoker  . Smokeless tobacco: Never Used  Substance Use Topics  . Alcohol use: Yes    Comment: Social   . Drug use: Never    Review of Systems Per HPI unless specifically indicated above     Objective:    BP (!) 93/57 (BP Location: Left Arm, Patient Position: Sitting, Cuff Size: Normal)   Pulse 72   Temp (!) 97.1 F (36.2 C) (Temporal)   Resp 18   Ht 5\' 5"  (1.651 m)   Wt 131 lb 3.2 oz (59.5 kg)   SpO2 100%   BMI 21.83 kg/m   Wt Readings from Last 3 Encounters:  12/20/20 131 lb 3.2 oz (59.5 kg)  09/19/20 137 lb (62.1 kg)  05/17/20 138 lb 6.4 oz (62.8 kg)    Physical Exam Vitals and nursing  note reviewed.  Constitutional:      General: She is not in acute distress.    Appearance: She is well-developed and well-nourished. She is not diaphoretic.     Comments: Well-appearing, comfortable, cooperative  HENT:     Head: Normocephalic and atraumatic.     Mouth/Throat:     Mouth: Oropharynx is clear and moist.  Eyes:     General:        Right eye: No discharge.        Left eye: No discharge.     Conjunctiva/sclera: Conjunctivae normal.  Cardiovascular:     Rate and Rhythm: Normal rate.  Pulmonary:     Effort: Pulmonary effort is normal.  Musculoskeletal:        General: No edema.     Comments: Left Hand/Wrist Inspection:  Normal appearance, symmetrical, no bulky MCP joints, no edema or erythema. Palpation: Non tender hand / wrist, carpal bones, including MCP, base of thumb. No distinct anatomical snuff box or scaphoid tenderness. Mild to moderate tenderness over APL / EPB tendons radially. ROM: full active wrist ROM flex / ext, ulnar / radial deviation, pain with radial deviation Special Testing: Negative tinel's.  Finkelstein's test positive with pain Strength: 5/5 grip, thumb opposition, wrist flex/ext Neurovascular: distally intact   Skin:    General: Skin is warm and dry.     Findings: No erythema or rash.     Comments: Left foot No obvious callus, some slight thicker skin at great toe but area of concern between 2nd to 3rd toes with no issue. No obvious neuroma  Neurological:     Mental Status: She is alert and oriented to person, place, and time.  Psychiatric:        Mood and Affect: Mood and affect normal.        Behavior: Behavior normal.     Comments: Well groomed, good eye contact, normal speech and thoughts    Results for orders placed or performed in visit on 12/20/20  POCT glycosylated hemoglobin (Hb A1C)  Result Value Ref Range   Hemoglobin A1C 6.4 (A) 4.0 - 5.6 %      Assessment & Plan:   Problem List Items Addressed This Visit    Familial hypercholesterolemia    Continue on PCSK9 therapy      Controlled type 2 diabetes mellitus with diabetic nephropathy ([place]) - Primary    Improved A1c to 6.4, dramatic improvement now well controlled Complications - CKD-III, other including hyperlipidemia - increases risk of future cardiovascular complications   Plan:  1. Continue current therapy - Ozempic 1mg  weekly inj, Jardiance 25mg  daily 2. Encourage improved lifestyle - low carb, low sugar diet, reduce portion size, continue improving regular exercise 3. Check CBG on CGM / also OneTouch Verio for calibration and PRN check re order strips. 4. Negative microalbumin 04/2020. Not on acei/arb -  next due 04/2021 5. Statin intolerance - CONTINUE PCSK9 inhib Praluent 6. UTD DM Foot / Eye  Future consideration for adjusting doses of Ozempic vs Jardiance, may reduce Jardiance to lower dose if preferred by patient. Discussed no concern regarding blood sugar to lower dose and the benefits of both meds as well. Agree to keep dose for now.      Relevant Medications   ONETOUCH VERIO test strip   Other Relevant Orders   POCT glycosylated hemoglobin (Hb A1C) (Completed)    Other Visit Diagnoses    Left wrist pain          #L  Foot Possible early callus Consider morton neuroma, but not obvious on exam today Reassurance  #L Wrist possible tendonitis radially Consider DeQuervain's Handout given on advice, can use topical NSAID Voltaren Use wrist / thumb spica splint, avoid repetitive activity on it Follow up  Meds ordered this encounter  Medications  . ONETOUCH VERIO test strip    Sig: Check blood sugar up to 1-2 times daily as advised.    Dispense:  150 each    Refill:  3    E11.21, One Touch Verio Meter      Follow up plan: Return in about 5 months (around 05/19/2021) for 5 month fasting lab only then 1 week later Annual Physical.  Future labs ordered for 05/2021  Nobie Putnam, [facility] 12/20/2020, 8:53 AM

## 2020-12-20 NOTE — Assessment & Plan Note (Signed)
Continue on PCSK9 therapy

## 2020-12-20 NOTE — Assessment & Plan Note (Signed)
Improved A1c to 6.4, dramatic improvement now well controlled Complications - CKD-III, other including hyperlipidemia - increases risk of future cardiovascular complications   Plan:  1. Continue current therapy - Ozempic 1mg  weekly inj, Jardiance 25mg  daily 2. Encourage improved lifestyle - low carb, low sugar diet, reduce portion size, continue improving regular exercise 3. Check CBG on CGM / also OneTouch Verio for calibration and PRN check re order strips. 4. Negative microalbumin 04/2020. Not on acei/arb - next due 04/2021 5. Statin intolerance - CONTINUE PCSK9 inhib Praluent 6. UTD DM Foot / Eye  Future consideration for adjusting doses of Ozempic vs Jardiance, may reduce Jardiance to lower dose if preferred by patient. Discussed no concern regarding blood sugar to lower dose and the benefits of both meds as well. Agree to keep dose for now.

## 2021-01-15 ENCOUNTER — Other Ambulatory Visit: Payer: Self-pay | Admitting: Family Medicine

## 2021-01-15 DIAGNOSIS — E785 Hyperlipidemia, unspecified: Secondary | ICD-10-CM

## 2021-01-15 DIAGNOSIS — G720 Drug-induced myopathy: Secondary | ICD-10-CM

## 2021-01-15 DIAGNOSIS — E1169 Type 2 diabetes mellitus with other specified complication: Secondary | ICD-10-CM

## 2021-01-15 DIAGNOSIS — E7801 Familial hypercholesterolemia: Secondary | ICD-10-CM

## 2021-01-15 DIAGNOSIS — E78019 Familial hypercholesterolemia, unspecified: Secondary | ICD-10-CM

## 2021-01-29 ENCOUNTER — Ambulatory Visit (INDEPENDENT_AMBULATORY_CARE_PROVIDER_SITE_OTHER): Payer: 59

## 2021-01-29 ENCOUNTER — Other Ambulatory Visit: Payer: Self-pay

## 2021-01-29 ENCOUNTER — Ambulatory Visit: Payer: 59 | Admitting: Orthopedic Surgery

## 2021-01-29 DIAGNOSIS — M79601 Pain in right arm: Secondary | ICD-10-CM | POA: Diagnosis not present

## 2021-01-31 ENCOUNTER — Ambulatory Visit: Payer: 59 | Admitting: Dermatology

## 2021-01-31 ENCOUNTER — Other Ambulatory Visit: Payer: Self-pay

## 2021-01-31 DIAGNOSIS — L309 Dermatitis, unspecified: Secondary | ICD-10-CM | POA: Diagnosis not present

## 2021-01-31 MED ORDER — CLOCORTOLONE PIVALATE 0.1 % EX CREA: TOPICAL_CREAM | CUTANEOUS | 0 refills | Status: DC

## 2021-01-31 MED ORDER — FINACEA 15 % EX FOAM
CUTANEOUS | 0 refills | Status: DC
Start: 2021-01-31 — End: 2022-07-02

## 2021-01-31 MED ORDER — DOXYCYCLINE MONOHYDRATE 100 MG PO CAPS: 100.0000 mg | ORAL_CAPSULE | Freq: Every evening | ORAL | 0 refills | Status: DC

## 2021-01-31 NOTE — Progress Notes (Signed)
   Follow-Up Visit   Subjective  Holly Steele is a 65 y.o. female who presents for the following: Skin Problem (Pt states that she got her face waxed on Monday and immediately after she was red and bumpy in those areas. She states that this reaction has never happened before after being waxed. ).  The following portions of the chart were reviewed this encounter and updated as appropriate:  Tobacco  Allergies  Meds  Problems  Med Hx  Surg Hx  Fam Hx     Review of Systems: No other skin or systemic complaints except as noted in HPI or Assessment and Plan.  Objective  Well appearing patient in no apparent distress; mood and affect are within normal limits.  A focused examination was performed including face. Relevant physical exam findings are noted in the Assessment and Plan.   Assessment & Plan  Dermatitis - folliculitis 2ndary to waxing face with inflammation Head - Anterior (Face) Start Doxycycline 100 mg qd with evening meal.   Doxycycline should be taken with food to prevent nausea. Do not lay down for 30 minutes after taking. Be cautious with sun exposure and use good sun protection while on this medication. Pregnant women should not take this medication.   Start Finacea foam. Apply to affected areas on face twice a day for itch. Sample given.    Start Cloderm cream. Apply once a day to affected areas. Sample given.  doxycycline (MONODOX) 100 MG capsule - Head - Anterior (Face) Clocortolone Pivalate (CLODERM) 0.1 % cream - Head - Anterior (Face) Azelaic Acid (FINACEA) 15 % FOAM - Head - Anterior (Face)  Return if symptoms worsen or fail to improve.   IHarriett Sine, CMA, am acting as scribe for Sarina Ser, MD.  Documentation: I have reviewed the above documentation for accuracy and completeness, and I agree with the above.  Sarina Ser, MD

## 2021-01-31 NOTE — Patient Instructions (Signed)
Dove sensitive skin or Almay deodorant.

## 2021-02-01 ENCOUNTER — Encounter: Payer: Self-pay | Admitting: Dermatology

## 2021-02-04 ENCOUNTER — Encounter: Payer: Self-pay | Admitting: Orthopedic Surgery

## 2021-02-04 NOTE — Progress Notes (Signed)
Office Visit Note   Patient: Holly Steele           Date of Birth: 1956/09/01           MRN: [id number] Visit Date: 01/29/2021 Requested by: Olin Hauser, DO [address] [place],  [place] [address] PCP: Olin Hauser, DO  Subjective: Chief Complaint  Patient presents with  . Right Shoulder - Pain    HPI: Holly Steele is a 65 y.o. female who presents to the office complaining of right shoulder pain.  Patient notes greater than 1 year of shoulder pain.  She localizes most of the pain in the past and her shoulder blade but more recently she has been feeling lateral shoulder pain as well.  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  However in the year since retirement, she has had persistent scapular and new lateral shoulder pain even without using a computer very often.  She is right-hand dominant.  This pain wakes her up at night.  She complains of associated neck pain and occasionally has numbness and tingling that wakes her from sleep but never happens during the day.  She has had a couple falls over the last year and one fall was where she fell onto her right shoulder and was dragged by her 80 pound dog about a month ago.  She likes to go to the gym and lift weights but she has difficulty with weights overhead more and more recently.  Pain radiates down into her bicep but does not radiate past the elbow.  She has tried plenty of conservative means of managing her pain including topicals, massage therapy once per month, chiropractor once per month, going to the gym 2 times per week, over-the-counter medications.  She has no history of neck or shoulder surgery.  She is not able to sleep on her right side.  Does not get better with lifting her arm overhead..                ROS: All systems reviewed are negative as they relate to the chief complaint within the history of present  illness.  Patient denies fevers or chills.  Assessment & Plan: Visit Diagnoses:  1. Right arm pain     Plan: Patient is a 65 year old female who presents complaining of right shoulder pain.  Most of her pain is in the right scapula and the lateral aspect of the shoulder with some radiation into the bicep.  She has had greater than 1 year of symptoms and failure of conservative management thus far.  She has associated neck pain as well.  Radiographs taken today show no abnormalities on right shoulder radiographs but she does have mild degeneration throughout the cervical spine.  With her longstanding symptoms and lack of significant improvement with difficulty sleeping, plan to order MRI scan.  MRI of the cervical spine was ordered for evaluation of right radiculopathy with her persistent posterior scapular pain.  MRI arthrogram of the right shoulder was also ordered to evaluate for labral tear which likely represents the lateral shoulder pain with radiation into her bicep.  Follow-up after MRIs to review results.  Follow-Up Instructions: No follow-ups on file.   Orders:  Orders Placed This Encounter  Procedures  . XR Cervical Spine 2 or 3 views  . XR Shoulder Right  . MR Cervical Spine w/o contrast  .  MR SHOULDER RIGHT W CONTRAST  . Arthrogram   No orders of the defined types were placed in this encounter.     Procedures: No procedures performed   Clinical Data: No additional findings.  Objective: Vital Signs: There were no vitals taken for this visit.  Physical Exam:  Constitutional: Patient appears well-developed HEENT:  Head: Normocephalic Eyes:EOM are normal Neck: Normal range of motion Cardiovascular: Normal rate Pulmonary/chest: Effort normal Neurologic: Patient is alert Skin: Skin is warm Psychiatric: Patient has normal mood and affect  Ortho Exam: Ortho exam demonstrates right shoulder with 50 degrees external rotation, 95 degrees abduction, 160 degrees forward  flexion.  This is compared with the left shoulder which has 45 degrees external rotation, 110 degrees abduction, 165 degrees forward flexion.  Tenderness over the bicipital groove of the right shoulder.  Very slight subscapularis weakness with subscap strength rated at 5 -/5.  Supraspinatus and infraspinatus rated at 5/5 of the right shoulder.  5/5 motor strength of grip strength, finger abduction, pronation/supination, bicep, tricep, deltoid.  Positive Neer's impingement sign.  Negative Hawkins impingement sign.  Negative drop arm test.  Positive O'Brien sign.  Negative Hornblower sign.  No tenderness over the [facility] joint.  Specialty Comments:  No specialty comments available.  Imaging: No results found.   PMFS History: Patient Active Problem List   Diagnosis Date Noted  . Familial hypercholesterolemia 05/17/2020  . CKD (chronic kidney disease), stage III ([place]) 05/15/2020  . Diabetic nephropathy associated with type 2 diabetes mellitus ([place]) 05/15/2020  . Rash and nonspecific skin eruption 05/05/2020  . Vitamin B12 deficiency 04/19/2020  . Controlled type 2 diabetes mellitus with diabetic nephropathy ([place]) 04/19/2020  . Asthma in adult 04/19/2020  . Hyperlipidemia associated with type 2 diabetes mellitus ([place]) 04/19/2020  . 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 Hepatic steatosis 04/19/2020   Past Medical History:  Diagnosis Date  . Asthma    past  . Atypical mole 08/14/2020   L lat buttocks   . Osteoporosis     Family History  Problem Relation Age of Onset  . Hypertension Mother   . Alcohol abuse Father   . Diabetes Father   . Diabetes Sister   . Diabetes Brother     Past Surgical History:  Procedure Laterality Date  . POLYPECTOMY     Social History   Occupational History  . Not on file  Tobacco Use  . Smoking status: Never Smoker  . Smokeless tobacco: Never Used  Substance and Sexual Activity  . Alcohol use: Yes    Comment: Social   . Drug use: Never  . Sexual  activity: Not Currently

## 2021-02-17 ENCOUNTER — Other Ambulatory Visit: Payer: Self-pay

## 2021-02-17 ENCOUNTER — Ambulatory Visit
Admission: RE | Admit: 2021-02-17 | Discharge: 2021-02-17 | Disposition: A | Discharge status: Home / Self Care | Payer: 59 | Source: Ambulatory Visit | Attending: Orthopedic Surgery | Admitting: Orthopedic Surgery

## 2021-02-17 DIAGNOSIS — M79601 Pain in right arm: Secondary | ICD-10-CM

## 2021-02-17 IMAGING — MR MR CERVICAL SPINE W/O CM
5 series · 30 of 48 positions shown · non-contrast
Comparison: Prior [DATE].

CLINICAL DATA: Initial evaluation for chronic right-sided radicular
pain, pain in right shoulder and arm.

EXAM:
MRI CERVICAL SPINE WITHOUT CONTRAST
TECHNIQUE: Multiplanar, multisequence MR imaging of the cervical spine was
performed. No intravenous contrast was administered.

[Series 5: T2 · sagittal · 3.0mm · 0.66mm/px · 6 of 15 slices shown (1 of 2)]
[im 1/15]
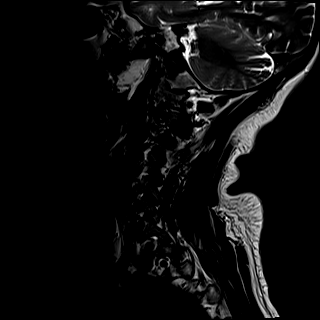
[im 3/15]
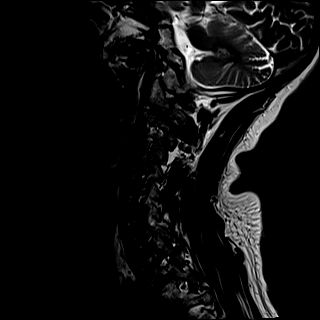
[im 6/15]
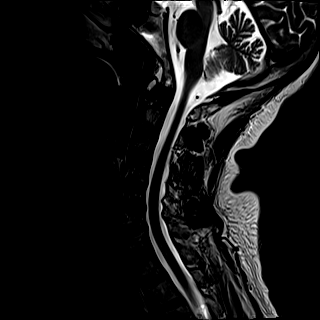
[im 9/15]
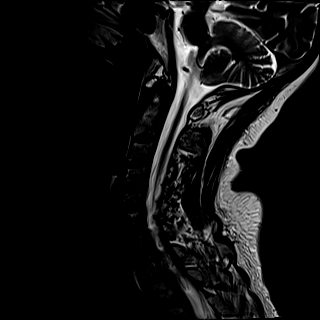
[im 12/15]
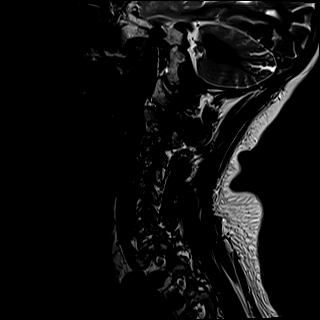
[im 15/15]
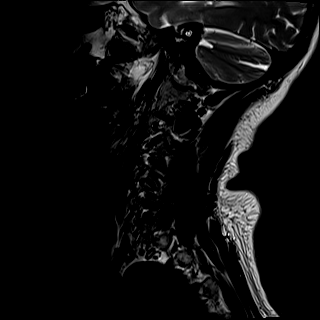

[Series 6: T1 · sagittal · 3.0mm · 0.82mm/px · 7 of 15 slices shown]
[im 1/15]
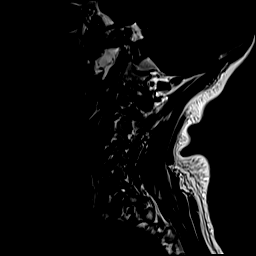
[im 3/15]
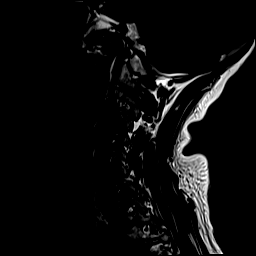
[im 5/15]
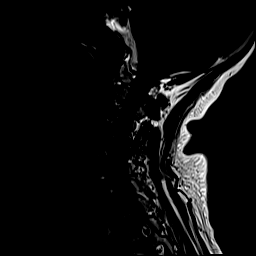
[im 8/15]
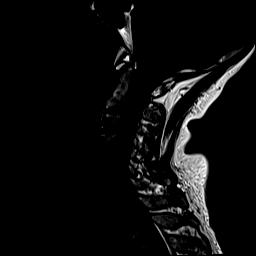
[im 10/15]
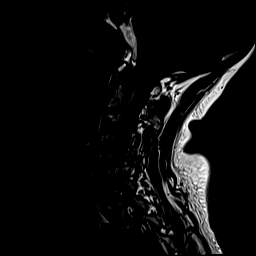
[im 12/15]
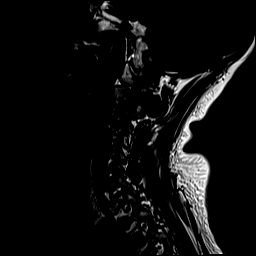
[im 15/15]
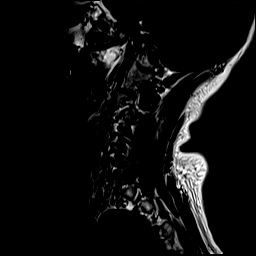

[Series 7: STIR · sagittal · 3.0mm · 0.33mm/px · 7 of 15 slices shown]
[im 1/15]
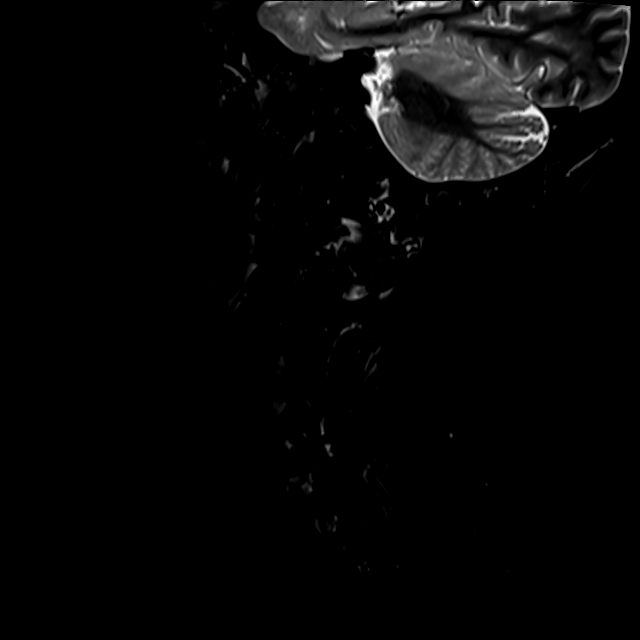
[im 3/15]
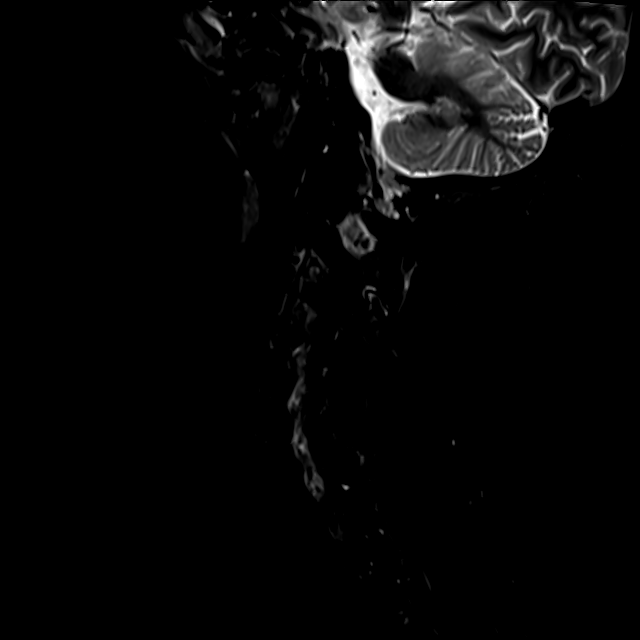
[im 5/15]
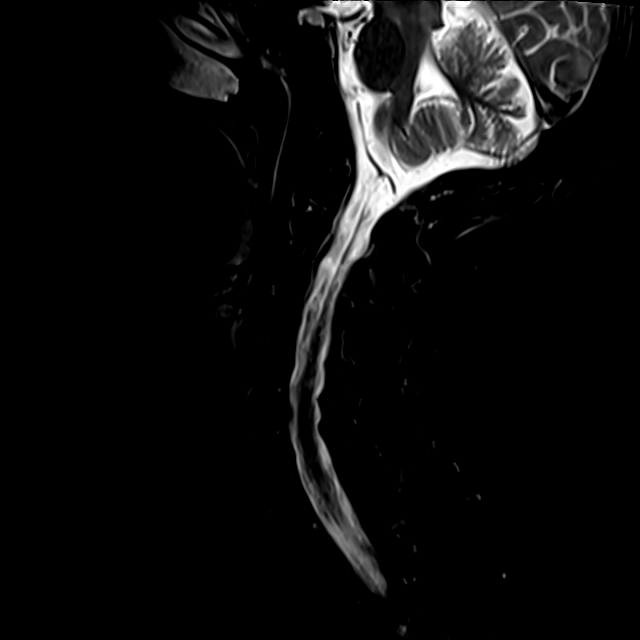
[im 8/15]
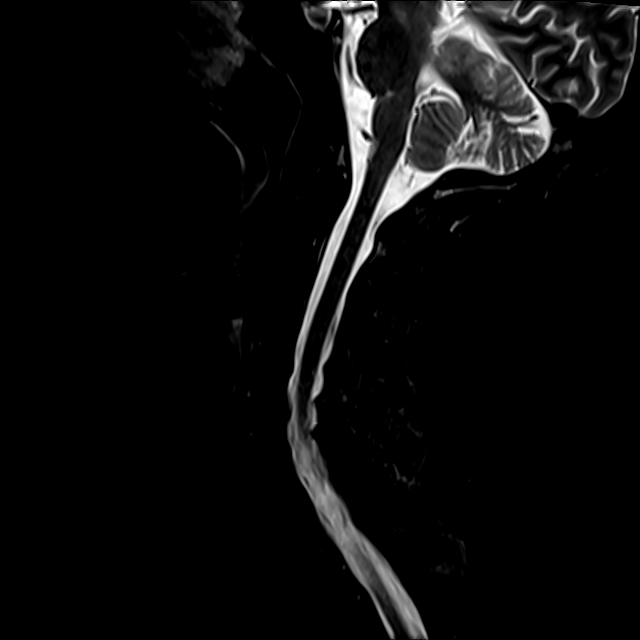
[im 10/15]
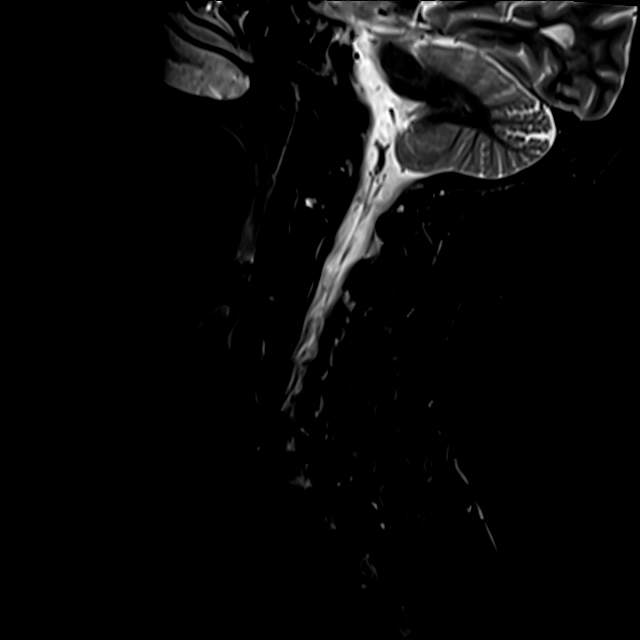
[im 12/15]
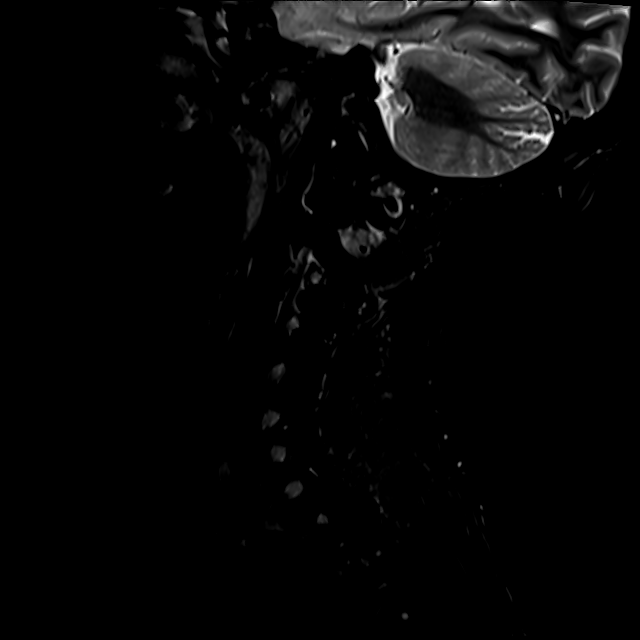
[im 15/15]
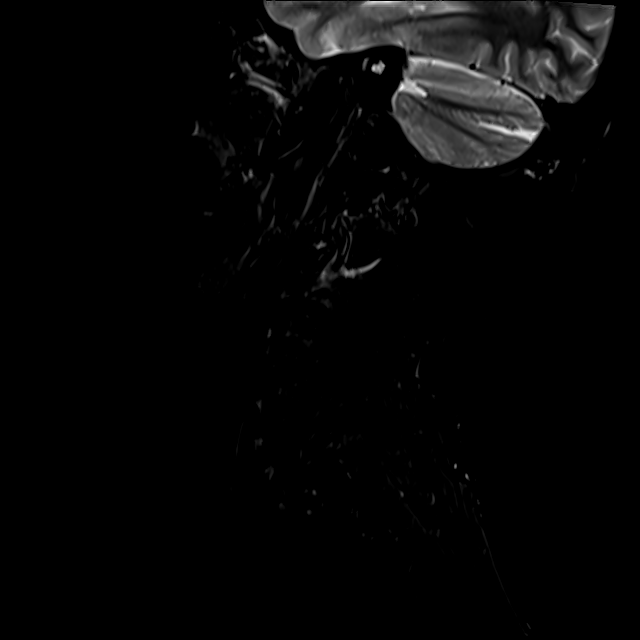

[Series 8: T2 · axial · 3.0mm · 0.62mm/px · z∈[-101,-11]mm · 8 of 30 slices shown (2 of 2)]
[im 1/30]
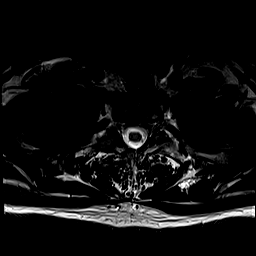
[im 5/30]
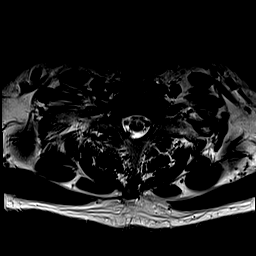
[im 9/30]
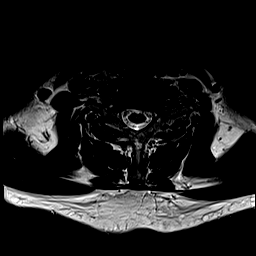
[im 14/30]
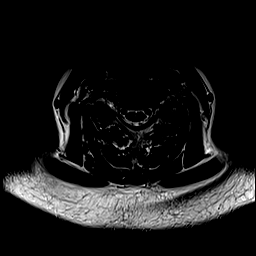
[im 16/30]
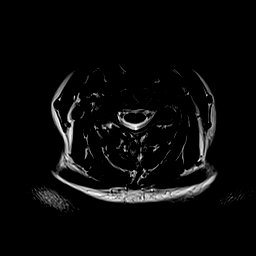
[im 21/30]
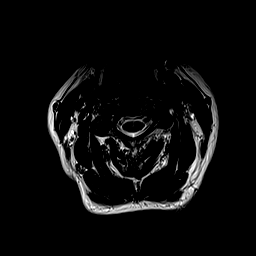
[im 25/30]
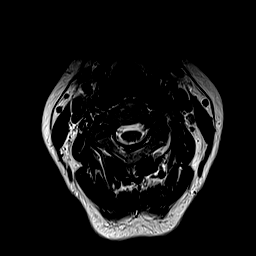
[im 30/30]
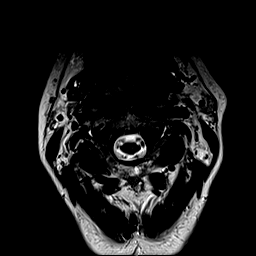

[Series 9: GRE · axial · 3.0mm · 0.42mm/px · z∈[-101,-89]mm · 2 of 30 slices shown]
[im 1/30]
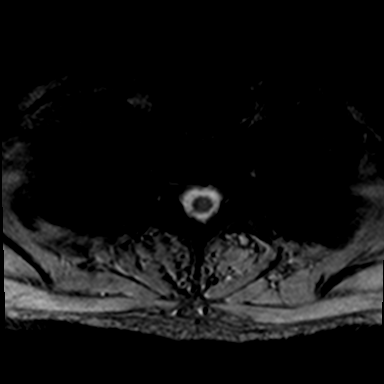
[im 5/30]
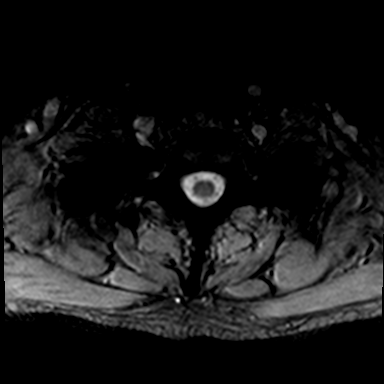

[30 of 48 positions shown; findings below may reference images not displayed]

FINDINGS: Alignment: Vertebral bodies normally aligned with preservation of
the cervical lordosis. No listhesis.

Vertebrae: Vertebral body height maintained without acute or chronic
fracture. Bone marrow signal intensity within normal limits. No
worrisome osseous lesions. No abnormal marrow edema.

Cord: Normal signal and morphology.

Posterior Fossa, vertebral arteries, paraspinal tissues: Visualized
brain and posterior fossa within normal limits. Craniocervical
junction normal. Paraspinous and prevertebral soft tissues within
normal limits. Normal intravascular flow voids seen within the
vertebral arteries bilaterally.

Disc levels:

C2-C3: Unremarkable.

C3-C4:  Unremarkable.

C4-C5: Mild disc bulge with uncovertebral spurring. No spinal
stenosis. Foramina remain patent.

C5-C6: Small central disc protrusion mildly indents the ventral
thecal sac (series 9, image 17). No significant spinal stenosis.
Mild uncovertebral spurring without significant foraminal
encroachment.

C6-C7: Small central disc protrusion mildly indents the ventral
thecal sac (series 9, image 21). No significant spinal stenosis.
Superimposed mild uncovertebral spurring without significant
foraminal encroachment.

C7-T1:  Unremarkable.

Visualized upper thoracic spine demonstrates no significant finding.
IMPRESSION: 1. Small central disc protrusions at C5-6 and C6-7 without
significant stenosis or neural impingement.
2. Mild noncompressive disc bulging at C4-5 without stenosis.
3. Otherwise essentially normal MRI of the cervical spine. No other
findings to explain patient's right-sided radicular symptoms
identified.

## 2021-02-21 ENCOUNTER — Ambulatory Visit
Admission: RE | Admit: 2021-02-21 | Discharge: 2021-02-21 | Disposition: A | Discharge status: Home / Self Care | Payer: No Typology Code available for payment source | Source: Ambulatory Visit | Attending: Orthopedic Surgery | Admitting: Orthopedic Surgery

## 2021-02-21 ENCOUNTER — Other Ambulatory Visit: Payer: Self-pay

## 2021-02-21 DIAGNOSIS — M79601 Pain in right arm: Secondary | ICD-10-CM

## 2021-02-21 IMAGING — MR MR SHOULDER*R* W/CM
6 series · 40 of 40 positions shown · IV contrast (agent unspecified)
Comparison: Plain films right shoulder [DATE].

CLINICAL DATA: Chronic right shoulder pain.  No known injury.

EXAM:
MR ARTHROGRAM OF THE RIGHT SHOULDER
TECHNIQUE: Multiplanar, multisequence MR imaging of the right shoulder was
performed following the administration of intra-articular contrast.
CONTRAST:  See Injection Documentation.

[Series 3: T1 fat-sat · axial · 4.0mm · 0.27mm/px · z∈[-59,+26]mm · 8 of 18 slices shown (1 of 4)]
[im 1/18]
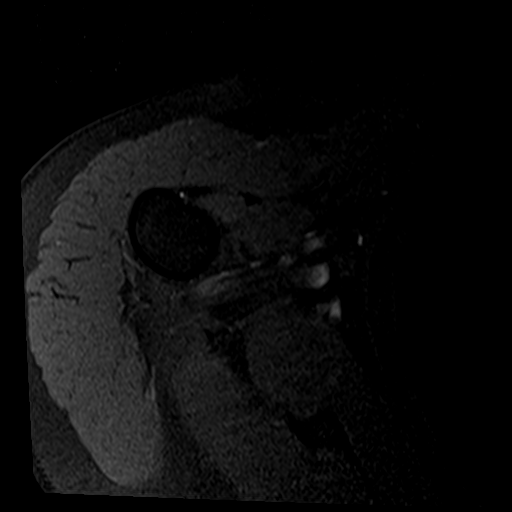
[im 3/18]
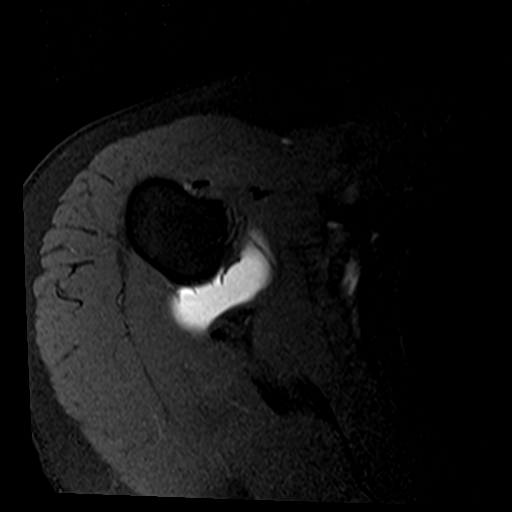
[im 5/18]
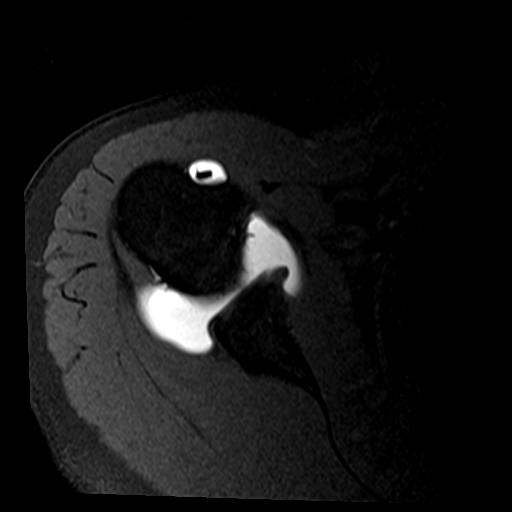
[im 8/18]
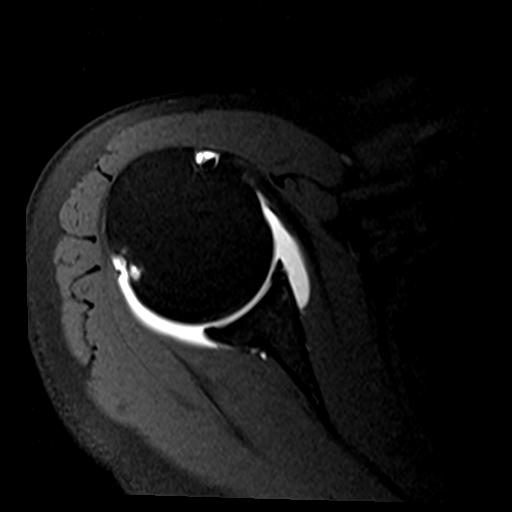
[im 10/18]
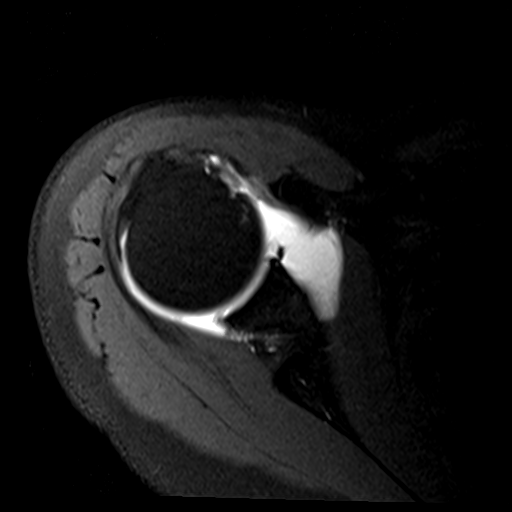
[im 13/18]
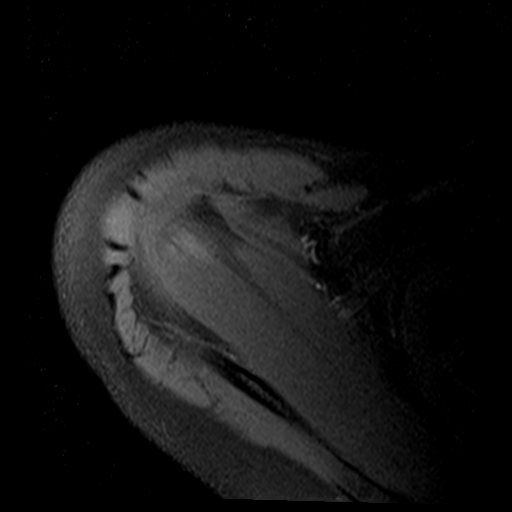
[im 15/18]
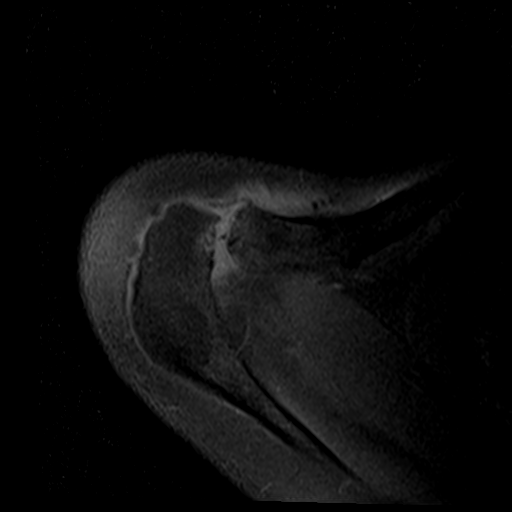
[im 18/18]
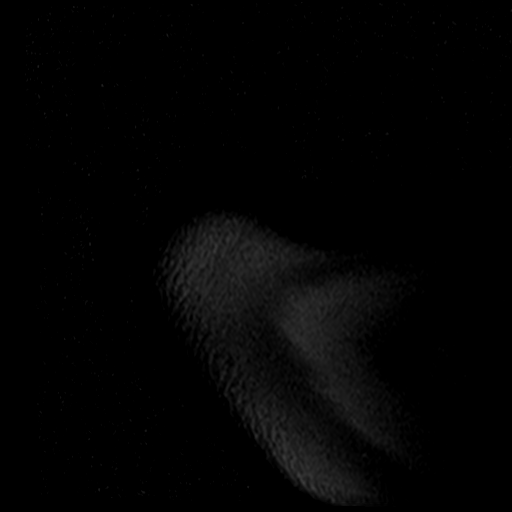

[Series 4: T2 fat-sat · coronal · 4.0mm · 0.55mm/px · 8 of 18 slices shown (1 of 2)]
[im 1/18]
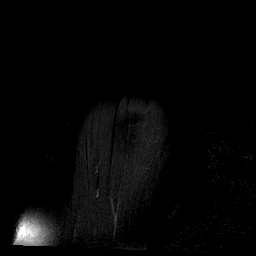
[im 3/18]
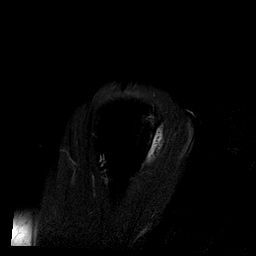
[im 5/18]
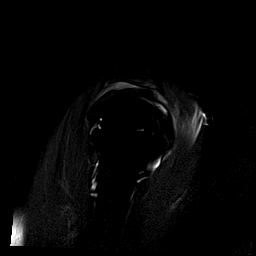
[im 8/18]
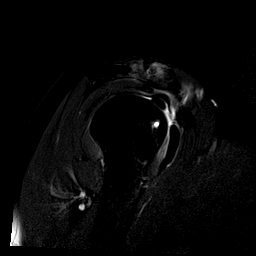
[im 10/18]
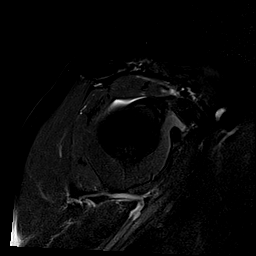
[im 13/18]
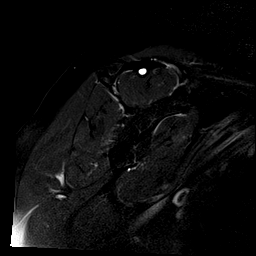
[im 15/18]
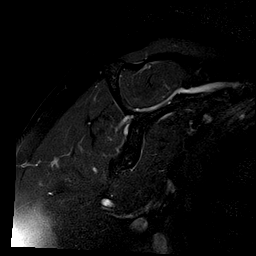
[im 18/18]
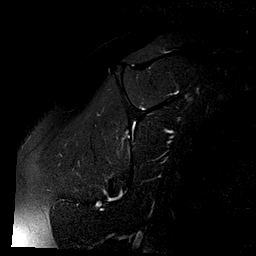

[Series 5: T1 fat-sat · oblique · 4.0mm · 0.55mm/px · 6 of 16 slices shown (2 of 4)]
[im 1/16]
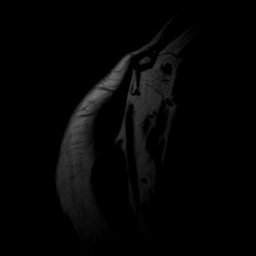
[im 4/16]
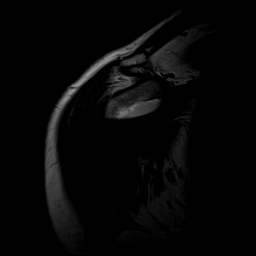
[im 7/16]
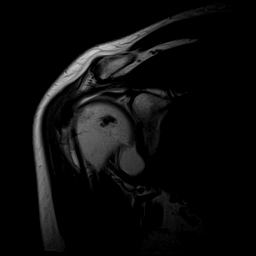
[im 10/16]
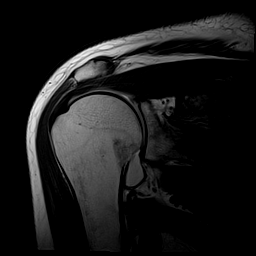
[im 13/16]
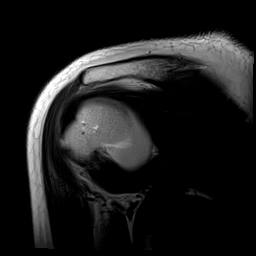
[im 16/16]
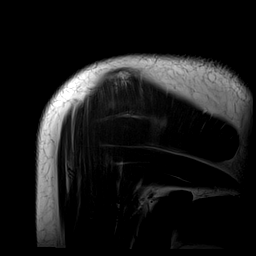

[Series 6: T1 fat-sat · oblique · 4.0mm · 0.55mm/px · 6 of 16 slices shown (3 of 4)]
[im 1/16]
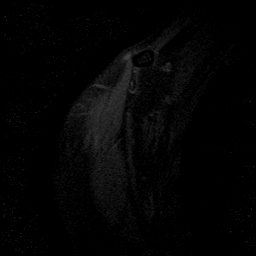
[im 4/16]
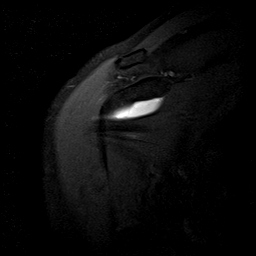
[im 7/16]
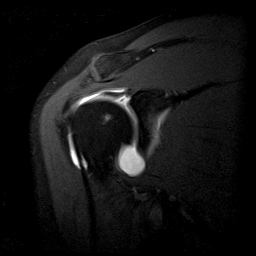
[im 10/16]
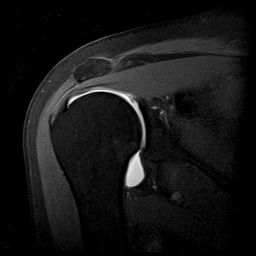
[im 13/16]
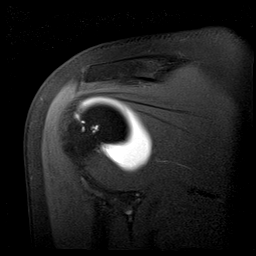
[im 16/16]
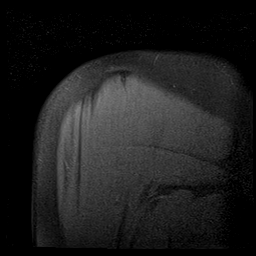

[Series 7: T2 fat-sat · oblique · 4.0mm · 0.55mm/px · 6 of 16 slices shown (2 of 2)]
[im 1/16]
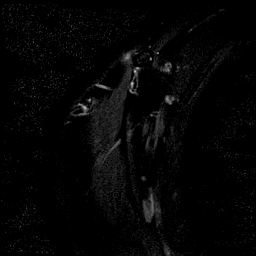
[im 4/16]
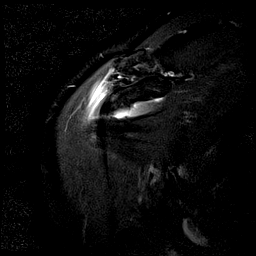
[im 7/16]
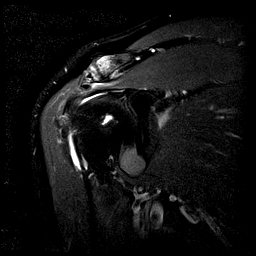
[im 10/16]
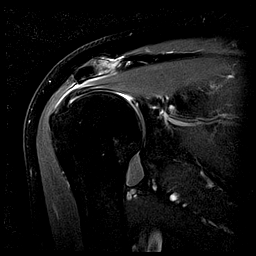
[im 13/16]
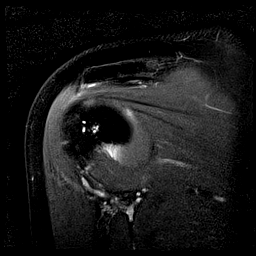
[im 16/16]
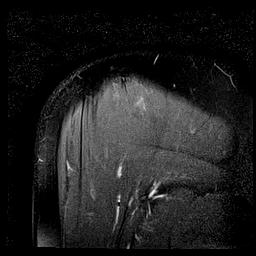

[Series 10: T1 fat-sat · sagittal · 4.0mm · 0.59mm/px · 6 of 16 slices shown (4 of 4)]
[im 1/16]
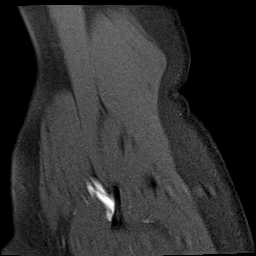
[im 4/16]
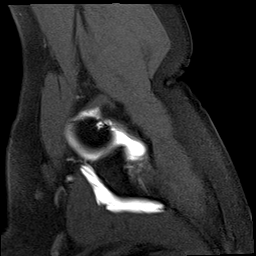
[im 7/16]
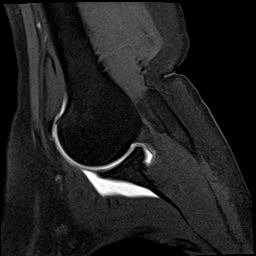
[im 10/16]
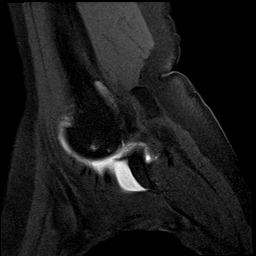
[im 13/16]
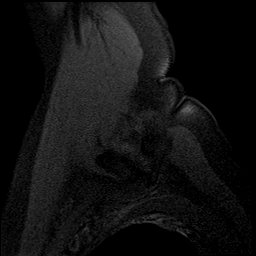
[im 16/16]
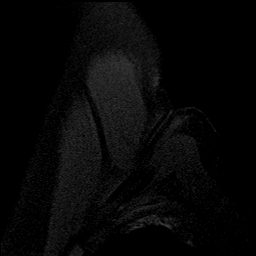

[40 of 40 positions shown; findings below may reference images not displayed]

FINDINGS: Rotator cuff: Intact. There is mild, heterogeneously increased T2
signal in the supraspinatus and infraspinatus tendons consistent
with tendinopathy.

Muscles: Normal.  No atrophy or focal lesion.

Biceps long head: Intact.

Acromioclavicular Joint: Osteoarthritis is seen with marked marrow
edema about the joint. No contrast extravasates into the
subacromial/subdeltoid bursa but there is a small volume of fluid in
the bursa. The acromion is type 2.

Glenohumeral Joint: Appears normal.

Labrum: The superior labrum is degenerated and frayed.

Bones: No fracture or worrisome lesion.
IMPRESSION: Dominant finding is acromioclavicular osteoarthritis with intense
marrow edema about the joint.

Intact rotator cuff with mild appearing supraspinatus and
infraspinatus tendinopathy.

Degenerated and frayed superior labrum.

Small volume of fluid in the subacromial/subdeltoid bursa compatible
with bursitis.

## 2021-02-21 IMAGING — XA DG FLUORO GUIDE NDL PLC/BX
1 series · 1 of 1 positions shown · IV contrast (multihance)
Comparison: none

CLINICAL DATA: Right shoulder pain.

EXAM:
RIGHT SHOULDER INJECTION UNDER FLUOROSCOPY
TECHNIQUE: An appropriate skin entrance site was determined. The site was
marked, prepped with Betadine, draped in the usual sterile fashion,
and infiltrated locally with 1% lidocaine. A 22 gauge spinal needle
was advanced to the superomedial margin of the humeral head under
intermittent fluoroscopy. 1 mL of 1% lidocaine injected easily. A
mixture of 0.1 mL of MultiHance, 15 mL of Isovue-M 200, and 5 mL of
sterile saline was then used to opacify the right shoulder capsule.
12 mL of this mixture were injected. No immediate complication.
FLUOROSCOPY TIME:  Fluoroscopy Time:  6 seconds
Radiation Exposure Index (if provided by the fluoroscopic device):
1.44 microGray*m^2
Number of Acquired Spot Images: 0

[Series 1: ortho adipose · 1 of 1 slices shown]
[im 1/1]
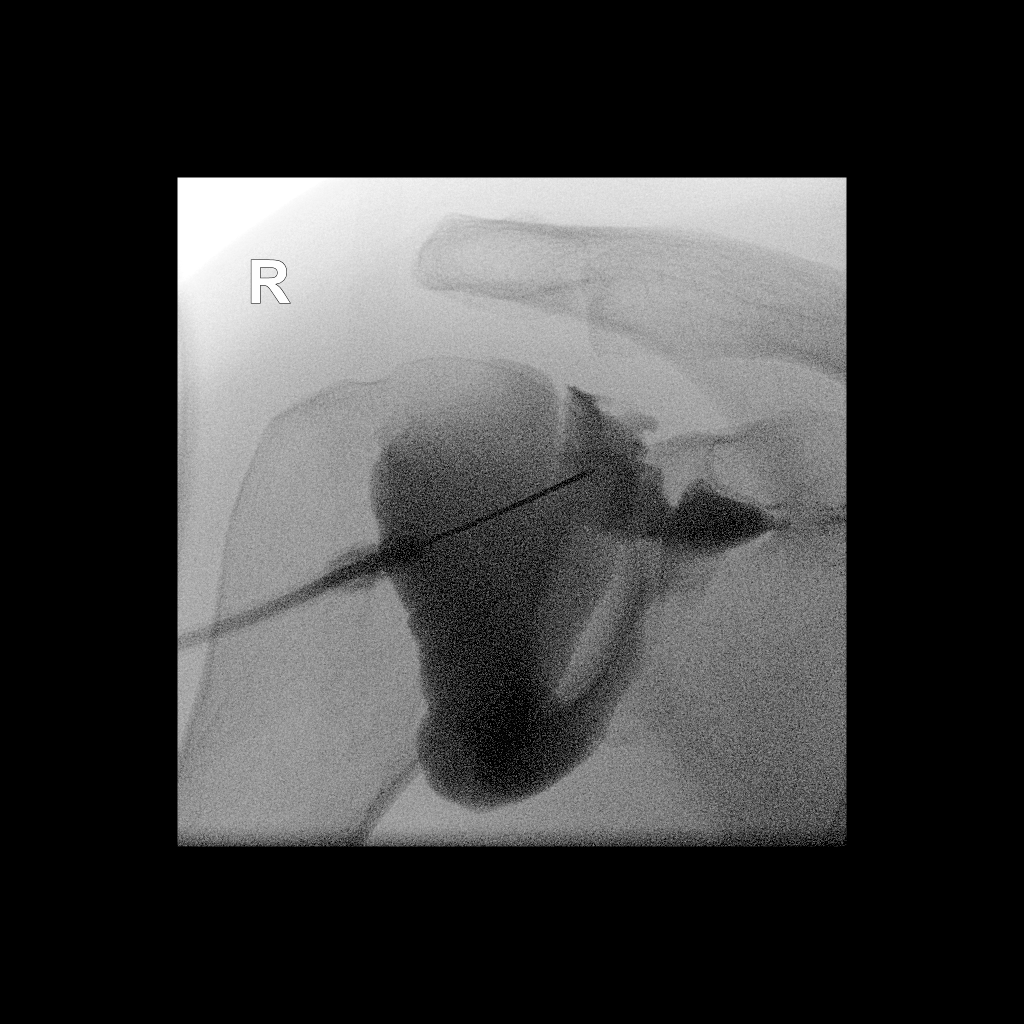

[1 of 1 positions shown; findings below may reference images not displayed]

IMPRESSION: Technically successful right shoulder injection for MRI.

## 2021-02-21 MED ORDER — IOPAMIDOL (ISOVUE-M 200) INJECTION 41%
20.0000 mL | Freq: Once | INTRAMUSCULAR | Status: AC
  Administered 2021-02-21: 20 mL via INTRA_ARTICULAR

## 2021-03-07 ENCOUNTER — Other Ambulatory Visit: Payer: Self-pay

## 2021-03-07 ENCOUNTER — Encounter: Payer: Self-pay | Admitting: Orthopedic Surgery

## 2021-03-07 ENCOUNTER — Ambulatory Visit: Payer: 59 | Admitting: Orthopedic Surgery

## 2021-03-07 ENCOUNTER — Ambulatory Visit: Payer: Self-pay

## 2021-03-07 DIAGNOSIS — M79601 Pain in right arm: Secondary | ICD-10-CM

## 2021-03-07 DIAGNOSIS — M19011 Primary osteoarthritis, right shoulder: Secondary | ICD-10-CM

## 2021-03-07 MED ORDER — BUPIVACAINE HCL 0.25 % IJ SOLN
0.6600 mL | INTRAMUSCULAR | Status: AC | PRN
  Administered 2021-03-07: .66 mL via INTRA_ARTICULAR

## 2021-03-07 MED ORDER — METHYLPREDNISOLONE ACETATE 40 MG/ML IJ SUSP
13.3300 mg | INTRAMUSCULAR | Status: AC | PRN
  Administered 2021-03-07: 13.33 mg via INTRA_ARTICULAR

## 2021-03-07 MED ORDER — LIDOCAINE HCL 1 % IJ SOLN
3.0000 mL | INTRAMUSCULAR | Status: AC | PRN
  Administered 2021-03-07: 3 mL

## 2021-03-07 NOTE — Progress Notes (Signed)
Office Visit Note   Patient: Holly Steele           Date of Birth: [birth year]           MRN: [id number] Visit Date: 03/07/2021 Requested by: Olin Hauser, DO [address] [place],  [place] [address] PCP: Olin Hauser, DO  Subjective: Chief Complaint  Patient presents with  . Other     Scan review    HPI: Holly Steele is a patient with right scapular and shoulder pain who presents for follow-up after MRI scan of cervical spine and shoulder.  Shoulder MRI scan is reviewed with her and her husband.  The [facility] joint really likes up with a lot of edema in the distal end of the clavicle.  Some degeneration of the superior labrum without discrete tear.  Rotator cuff is intact.  MRI scan of the cervical spine shows mild degenerative changes but nothing really localizing to the right-hand side.  Patient continues to report pain in the scapula as well as the lateral shoulder.  Overhead motion is difficult.  She does take over-the-counter medication.  Hard for her to sleep on the right-hand side.              ROS: All systems reviewed are negative as they relate to the chief complaint within the history of present illness.  Patient denies  fevers or chills.   Assessment & Plan: Visit Diagnoses:  1. Right arm pain     Plan: Impression is right shoulder pain with really the only thing clearly showing up on the MRI scan is abnormal is a lot of edema in the [facility] joint.  Ultrasound-guided injection performed there today.  The medial border scapular pain looks to be more trigger point related as opposed to radicular.  No numbness and tingling in the arm.  Follow-up in 6 weeks to decide for or against another ultrasound-guided injection versus some type of arthroscopic intervention depending on her response to this injection.  Follow-Up Instructions: Return in about 6 weeks (around 04/18/2021).   Orders:  Orders Placed This Encounter  Procedures  . US Guided Needle Placement - No Linked  Charges   No orders of the defined types were placed in this encounter.     Procedures: Medium Joint Inj: R acromioclavicular on 03/07/2021 7:37 PM Indications: pain and diagnostic evaluation Details: 27 G 1.5 in needle, ultrasound-guided superior approach Medications: 13.33 mg methylPREDNISolone acetate 40 MG/ML; 0.66 mL bupivacaine 0.25 %; 3 mL lidocaine 1 % Outcome: tolerated well, no immediate complications Procedure, treatment alternatives, risks and benefits explained, specific risks discussed. Consent was given by the patient. Immediately prior to procedure a time out was called to verify the correct patient, procedure, equipment, support staff and site/side marked as required. Patient was prepped and draped in the usual sterile fashion.       Clinical Data: No additional findings.  Objective: Vital Signs: There were no vitals taken for this visit.  Physical Exam:   Constitutional: Patient appears well-developed HEENT:  Head: Normocephalic Eyes:EOM are normal Neck: Normal range of motion Cardiovascular: Normal rate Pulmonary/chest: Effort normal Neurologic: Patient is alert Skin: Skin is warm Psychiatric: Patient has normal mood and affect    Ortho Exam: Ortho exam demonstrates full active and passive range of motion of the shoulder on the right with no restriction of external rotation of 15 degrees of abduction.  Has mild tenderness on the right AC joint versus left.  Some pain with crossarm adduction.  Rotator cuff strength is intact infraspinatus supraspinatus subscap muscle testing.  No masses lymphadenopathy or skin changes noted in the shoulder girdle region has good cervical spine range of motion with no paresthesias C5-T1.  Specialty Comments:  No specialty comments available.  Imaging: US Guided Needle Placement - No Linked Charges  Result Date: 03/07/2021 Ultrasound imaging of the [facility] joint on the right-hand side demonstrates needle placement within the [facility]  joint with extravasation of fluid into the joint with no complicating features    PMFS History: Patient Active Problem List   Diagnosis Date Noted  . Familial hypercholesterolemia 05/17/2020  . CKD (chronic kidney disease), stage III ([place]) 05/15/2020  . Diabetic nephropathy associated with type 2 diabetes mellitus ([place]) 05/15/2020  . Rash and nonspecific skin eruption 05/05/2020  . Vitamin B12 deficiency 04/19/2020  . Controlled type 2 diabetes mellitus with diabetic nephropathy ([place]) 04/19/2020  . Asthma in adult 04/19/2020  . Hyperlipidemia associated with type 2 diabetes mellitus ([place]) 04/19/2020  . 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 Hepatic steatosis 04/19/2020   Past Medical History:  Diagnosis Date  . Asthma    past  . Atypical mole 08/14/2020   L lat buttocks   . Osteoporosis     Family History  Problem Relation Age of Onset  . Hypertension Mother   . Alcohol abuse Father   . Diabetes Father   . Diabetes Sister   . Diabetes Brother     Past Surgical History:  Procedure Laterality Date  . POLYPECTOMY     Social History   Occupational History  . Not on file  Tobacco Use  . Smoking status: Never Smoker  . Smokeless tobacco: Never Used  Substance and Sexual Activity  . Alcohol use: Yes    Comment: Social   . Drug use: Never  . Sexual activity: Not Currently

## 2021-04-07 ENCOUNTER — Other Ambulatory Visit: Payer: Self-pay | Admitting: Family Medicine

## 2021-04-07 DIAGNOSIS — F5101 Primary insomnia: Secondary | ICD-10-CM

## 2021-04-07 NOTE — Telephone Encounter (Signed)
Requested Prescriptions  Pending Prescriptions Disp Refills  . traZODone (DESYREL) 100 MG tablet [Pharmacy Med Name: TRAZODONE TAB 100MG ] 90 tablet 0    Sig: TAKE 1 TABLET AT BEDTIME     Psychiatry: Antidepressants - Serotonin Modulator Passed - 04/07/2021  2:48 PM      Passed - Valid encounter within last 6 months    Recent Outpatient Visits          3 months ago Controlled type 2 diabetes mellitus with diabetic nephropathy, without long-term current use of insulin ([place])   [facility], Devonne Doughty, DO   6 months ago Type 2 diabetes mellitus with hyperglycemia, without long-term current use of insulin [facility])   [place], DO   10 months ago Type 2 diabetes mellitus with hyperglycemia, without long-term current use of insulin [facility])   [facility] Olin Hauser, DO   11 months ago [facility] [place], Lupita Raider, FNP   11 months ago Type 2 diabetes mellitus with hyperglycemia, without long-term current use of insulin [facility])   [facility], Devonne Doughty, DO      Future Appointments            In 1 week Marlou Sa, Tonna Corner, MD [facility]

## 2021-04-14 ENCOUNTER — Other Ambulatory Visit: Payer: Self-pay | Admitting: Family Medicine

## 2021-04-14 DIAGNOSIS — G720 Drug-induced myopathy: Secondary | ICD-10-CM

## 2021-04-14 DIAGNOSIS — E1169 Type 2 diabetes mellitus with other specified complication: Secondary | ICD-10-CM

## 2021-04-14 DIAGNOSIS — E7801 Familial hypercholesterolemia: Secondary | ICD-10-CM

## 2021-04-14 NOTE — Telephone Encounter (Signed)
Requested Prescriptions  Pending Prescriptions Disp Refills  . PRALUENT 75 MG/ML SOAJ [Pharmacy Med Name: PRALUENT 75 MG/ML PEN] 2 mL 2    Sig: INJECT 75 MG INTO THE SKIN EVERY 14 (FOURTEEN) DAYS.     Cardiovascular: PCSK9 Inhibitors Failed - 04/14/2021  9:09 AM      Failed - HDL in normal range and within 360 days    HDL  Date Value Ref Range Status  09/12/2020 47 (L) > OR = 50 mg/dL Final         Passed - Total Cholesterol in normal range and within 360 days    Cholesterol  Date Value Ref Range Status  09/12/2020 156 <200 mg/dL Final         Passed - LDL in normal range and within 360 days    LDL Cholesterol (Calc)  Date Value Ref Range Status  09/12/2020 86 mg/dL (calc) Final    Comment:    Reference range: <100 . Desirable range <100 mg/dL for primary prevention;   <70 mg/dL for patients with CHD or diabetic patients  with > or = 2 CHD risk factors. [name] LDL-C is now calculated using the Martin-Hopkins  calculation, which is a validated novel method providing  better accuracy than the Friedewald equation in the  estimation of LDL-C.  Cresenciano Genre et al. Annamaria Helling. 1638;453(64): 2061-2068  (http://education.[employer].com/faq/FAQ164)          Passed - Triglycerides in normal range and within 360 days    Triglycerides  Date Value Ref Range Status  09/12/2020 125 <150 mg/dL Final         Passed - Valid encounter within last 12 months    Recent Outpatient Visits          3 months ago Controlled type 2 diabetes mellitus with diabetic nephropathy, without long-term current use of insulin ([place])   [facility], Devonne Doughty, DO   6 months ago Type 2 diabetes mellitus with hyperglycemia, without long-term current use of insulin [facility])   [facility], Devonne Doughty, DO   11 months ago Type 2 diabetes mellitus with hyperglycemia, without long-term current use of insulin [facility])   [facility] Olin Hauser, DO   11 months ago [facility] [place], Lupita Raider, FNP   12 months ago Type 2 diabetes mellitus with hyperglycemia, without long-term current use of insulin [facility])   [facility] Olin Hauser, DO      Future Appointments            In 1 week Marlou Sa, Tonna Corner, MD [facility]

## 2021-04-18 ENCOUNTER — Ambulatory Visit: Payer: 59 | Admitting: Orthopedic Surgery

## 2021-04-25 ENCOUNTER — Other Ambulatory Visit: Payer: Self-pay

## 2021-04-25 ENCOUNTER — Ambulatory Visit: Payer: 59 | Admitting: Orthopedic Surgery

## 2021-04-25 DIAGNOSIS — M25511 Pain in right shoulder: Secondary | ICD-10-CM | POA: Diagnosis not present

## 2021-04-25 DIAGNOSIS — M19011 Primary osteoarthritis, right shoulder: Secondary | ICD-10-CM

## 2021-04-26 LAB — HM DIABETES EYE EXAM

## 2021-04-28 ENCOUNTER — Encounter: Payer: Self-pay | Admitting: Orthopedic Surgery

## 2021-04-28 NOTE — Progress Notes (Signed)
Office Visit Note   Patient: Holly Steele           Date of Birth: 04-16-1956           MRN: [id number] Visit Date: 04/25/2021 Requested by: Olin Hauser, DO [address] [place],  [place] [address] PCP: Olin Hauser, DO  Subjective: Chief Complaint  Patient presents with   Right Shoulder - Follow-up    HPI: Holly Steele is a 65 y.o. female who presents to the office complaining of right shoulder pain.  Patient returns for 6-week follow-up following AC joint injection on 03/07/2021.  Patient reports 100% relief of her pain at the beginning after the injection.  She does have continued relief but it is a little less than it was initially.  She now complains of no pain but a "tightness" in her shoulder.  She has some soreness in her shoulder blade superiorly on the right-hand side.  She also has some superior shoulder soreness but no severe pain that keeps her up at night any longer.  She is able to sleep on the right-hand side.  Denies any radicular pain or numbness/tingling or neck pain..                ROS: All systems reviewed are negative as they relate to the chief complaint within the history of present illness.  Patient denies fevers or chills.  Assessment & Plan: Visit Diagnoses:  1. Arthritis of right acromioclavicular joint   2. Trigger point of right shoulder region     Plan: Patient is a 65 year old female who presents for repeat evaluation of right shoulder pain.  She had excellent relief of right shoulder pain with AC joint injection at her last visit.  She does note some tightness and soreness along with some pain and a trigger point in her shoulder blade.  The severe pain that she was experiencing that was keeping her up at night is now no longer bothering her.  Impression is that she had release of majority of her pain with the [facility] joint ejection but she does have some persistent pain in the shoulder blade that seems more related to a trigger point.   Discussed options available to patient.  Plan to refer patient to physical therapy for right shoulder periscapular exercises and modalities for trigger point.  Follow-up in 6 weeks for clinical recheck and consider another injection or distal clavicle excision if her severe pain in the [facility] joint has returned.  Follow-Up Instructions: No follow-ups on file.   Orders:  No orders of the defined types were placed in this encounter.  No orders of the defined types were placed in this encounter.     Procedures: No procedures performed   Clinical Data: No additional findings.  Objective: Vital Signs: There were no vitals taken for this visit.  Physical Exam:  Constitutional: Patient appears well-developed HEENT:  Head: Normocephalic Eyes:EOM are normal Neck: Normal range of motion Cardiovascular: Normal rate Pulmonary/chest: Effort normal Neurologic: Patient is alert Skin: Skin is warm Psychiatric: Patient has normal mood and affect  Ortho Exam: Ortho exam demonstrates right shoulder with 70 degrees external rotation, 95 degrees abduction, 170 degrees forward flexion.  Mild AC joint tenderness.  Excellent rotator cuff strength of supra, infra, subscap.  Mild to moderate tenderness to palpation over trigger point superior to the scapula.  No pain with passive range of motion of the shoulder.  Pain with crossarm adduction.  No crepitus  Specialty Comments:  No specialty comments available.  Imaging: No results found.   PMFS History: Patient Active Problem List   Diagnosis Date Noted   Familial hypercholesterolemia 05/17/2020   CKD (chronic kidney disease), stage III ([place]) 05/15/2020   Diabetic nephropathy associated with type 2 diabetes mellitus ([place]) 05/15/2020   Rash and nonspecific skin eruption 05/05/2020   Vitamin B12 deficiency 04/19/2020   Controlled type 2 diabetes mellitus with diabetic nephropathy ([place]) 04/19/2020   Asthma in adult 04/19/2020   Hyperlipidemia  associated with type 2 diabetes mellitus ([place]) 04/19/2020   Drug-induced myopathy 04/19/2020   Hepatic steatosis 04/19/2020   Past Medical History:  Diagnosis Date   Asthma    past   Atypical mole 08/14/2020   L lat buttocks    Osteoporosis     Family History  Problem Relation Age of Onset   Hypertension Mother    Alcohol abuse Father    Diabetes Father    Diabetes Sister    Diabetes Brother     Past Surgical History:  Procedure Laterality Date   POLYPECTOMY     Social History   Occupational History   Not on file  Tobacco Use   Smoking status: Never   Smokeless tobacco: Never  Substance and Sexual Activity   Alcohol use: Yes    Comment: Social    Drug use: Never   Sexual activity: Not Currently

## 2021-04-30 ENCOUNTER — Other Ambulatory Visit: Payer: Self-pay

## 2021-04-30 DIAGNOSIS — M25511 Pain in right shoulder: Secondary | ICD-10-CM

## 2021-05-03 ENCOUNTER — Other Ambulatory Visit: Payer: Self-pay | Admitting: Family Medicine

## 2021-05-03 DIAGNOSIS — Z1231 Encounter for screening mammogram for malignant neoplasm of breast: Secondary | ICD-10-CM

## 2021-05-29 ENCOUNTER — Ambulatory Visit: Payer: 59 | Attending: Orthopedic Surgery | Admitting: Physical Therapy

## 2021-05-29 VITALS — BP 93/61 | HR 79

## 2021-05-29 DIAGNOSIS — G8929 Other chronic pain: Secondary | ICD-10-CM | POA: Diagnosis present

## 2021-05-29 DIAGNOSIS — M25511 Pain in right shoulder: Secondary | ICD-10-CM | POA: Diagnosis present

## 2021-05-29 NOTE — Therapy (Addendum)
[facility] [address], [place], [address] Phone: [phone]   Fax:  [phone]  Physical Therapy Evaluation  Patient Details  Name: Holly Steele MRN: [id number] Date of Birth: 10/06/1956 Referring Provider (PT): Marcene Duos, MD.  Encounter Date: 05/29/2021   PT End of Session - 05/29/21 1238     Visit Number 1    Number of Visits 11    Date for PT Re-Evaluation 06/23/21    Activity Tolerance Patient tolerated treatment well;No increased pain    Behavior During Therapy WFL for tasks assessed/performed             Past Medical History:  Diagnosis Date   Asthma    past   Atypical mole 08/14/2020   L lat buttocks    Osteoporosis     Past Surgical History:  Procedure Laterality Date   POLYPECTOMY      Vitals:   05/29/21 0847  BP: 93/61  Pulse: 79    CERIVCAL TRIAGE    5 D's And 3 N's: Denies All   denies diplopia, drop attacks, dysarthria, dysphagia, ataxia of  gait, and nystagmus; denies lightheadedness, numbness and occasional nausea   Cervical Radiculopathy Test cluster: No N/T; deferred rest of test    -rotation AROM <60 deg towards symptomatic side?:   -Spurling's test:   -response to manual traction:   -upper limb tension test A:     Scapular tests:   -scapular assistance test (SAT):   -scapular retraction test (SRT):   -Flip sign:   AROM (in standing):      R     L     Normal     Shoulder flexion:       180   180     180            extension:            60   60         60            abduction:            180  180     180   ER (combined reach):                     IR (combined reach):                   PROM (in supine):        R     L       Normal      Shoulder flexion:                          180            abduction:                               60                   ER:                                                     IR:    Strength:  R         L      Shoulder flexion:          5/5        5/5             abduction:              5/5        5/5        IR (0 deg abd):           5/5         5/5           ER (0 deg abd):          5/5           5/5  Grip                                 Good       Good   Periscapular MMT                                           R             L       Rhomboids                4/5           4/5       Mid Trap                    3+/5         3+/5      Lower Trap                  3/5            3/5   ---------------------------------------  Special Tests:                      SHOULDER                      R       L    Screen: (R UE)   -IRRST: ER=IR    IR>ER - RC tendinopathy/impingement and RCT IR<ER - Anterior instability, posterior instability, bankart lesion, SLAP, and posterior impingement  IR=ER - LHB tendinopathy, AC joint pathology,   ------------------------------------ Palpation/Accessory:  Mid scap R ttp on rhomboids and upper trap   THEREX:  Seated Shoulder Y's 1 x 10  -VC for positioning and elbow extension   Seated Rows Red TB 1 x 10  -VC on how to tye TB onto door know  -VC to keep shoulders at 0 deg abduction   Instruction on use of Theracane to find trigger points.     Subjective Assessment - 05/29/21 0847     Subjective Pt states she is experiencing right shoulder blade pain and now her left shoulder is compensating. 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 Also had injection in right shoulder which helped but it has since worn off.    Pertinent History 65 y.o. female who presents to the office complaining of right shoulder pain.  Patient returns for 6-week follow-up following  AC joint injection on 03/07/2021.  Patient reports 100% relief of her pain at the beginning after the injection.  She does have continued relief but it is a little less than it was initially.  She now complains of no pain  but a "tightness" in her shoulder.  She has some soreness in her shoulder blade superiorly on the right-hand side.  She also has some superior shoulder soreness but no severe pain that keeps her up at night any longer.  She is able to sleep on the right-hand side.  Denies any radicular pain or numbness/tingling or neck pain..    Limitations Other (comment)   Sleeping   How long can you sit comfortably? N/a    How long can you stand comfortably? N/a    How long can you walk comfortably? 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     Intact rotator cuff with mild appearing supraspinatus and  infraspinatus tendinopathy.     Degenerated and frayed superior labrum.     Small volume of fluid in the subacromial/subdeltoid bursa compatible  with bursitis.    Patient Stated Goals Reduce pain    Currently in Pain? Yes    Pain Score 0-No pain    Pain Onset More than a month ago    Pain Frequency Intermittent    Aggravating Factors  Laying on right shoulder    Pain Relieving Factors Corticosteriod shot. Light massage around shoulder blade    Effect of Pain on Daily Activities Keeps her up at night and she does not have energy to do daily tasks                [facility] PT Assessment - 05/29/21 0001       Assessment   Medical Diagnosis right shoulder pain    Referring Provider (PT) Marcene Duos, MD.    Hand Dominance Right    Prior Therapy No      [place] residence    Living Arrangements Spouse/significant other    Home Access Stairs to enter    Entrance Stairs-Number of Steps 10    Entrance Stairs-Rails Right      Prior Function   Level of Independence Independent            HEP includes following:   Access Code: [email or web address] URL: [email or web address] Date: 05/29/2021 Prepared by: Bradly Chris  Exercises Seated Shoulder Row with Anchored  Resistance - 1 x daily - 3 x weekly - 2 sets - 10 reps Seated Shoulder Y's - 1 x daily - 3 x weekly - 2 sets - 10 reps                   PT Short Term Goals - 05/29/21 1019       PT SHORT TERM GOAL #1   Title Patient will demonstrate independence and understanding with home exercise plan.    Time 2    Period Weeks    Status New    Target Date 06/12/21               PT Long Term Goals - 05/29/21 1158       PT LONG TERM GOAL #1   Title Patient will have improved function and activity level as evidenced by an increase in FOTO score by 10 points or more.    Baseline 7/26: 53/65    Time 5  Period Weeks    Target Date 07/03/21      PT LONG TERM GOAL #2   Title Patient will increase mid trap and lower trap strength to 4/5 in order to increase scapular stability and decrease shoulder blade pain.    Baseline 7/26: R & L  3/5 Low Trap, 3+/5 Mid Trap    Time 5    Period Weeks    Status New    Target Date 07/03/21                    Plan - 05/29/21 1239     Clinical Impression Statement Pt is a 65 yo female that presents for right periscapular pain. She demonstrates decreased periscapular strength and tenderness to palpation over upper trapezius and mid trapezius musculture. She will benefit from skilled PT to decrease periscapular pain in order to sleep through night without being woken up.    Personal Factors and Comorbidities Time since onset of injury/illness/exacerbation    Examination-Activity Limitations Sleep    Stability/Clinical Decision Making Stable/Uncomplicated    Clinical Decision Making Low    Rehab Potential Excellent    PT Frequency 2x / week    PT Duration Other (comment)   5 weeks   PT Treatment/Interventions Cryotherapy;Moist Heat;Manual techniques;Dry needling;Passive range of motion;Joint Manipulations;Therapeutic exercise;Therapeutic activities;Electrical Stimulation    PT Next Visit Plan Periscapular Stretching and Stretching     PT Home Exercise Plan [place] and Agree with Plan of Care Patient             Patient will benefit from skilled therapeutic intervention in order to improve the following deficits and impairments:  Pain, Decreased strength  Visit Diagnosis: 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     Problem List Patient Active Problem List   Diagnosis Date Noted   Familial hypercholesterolemia 05/17/2020   CKD (chronic kidney disease), stage III ([place]) 05/15/2020   Diabetic nephropathy associated with type 2 diabetes mellitus ([place]) 05/15/2020   Rash and nonspecific skin eruption 05/05/2020   Vitamin B12 deficiency 04/19/2020   Controlled type 2 diabetes mellitus with diabetic nephropathy ([place]) 04/19/2020   Asthma in adult 04/19/2020   Hyperlipidemia associated with type 2 diabetes mellitus ([place]) 04/19/2020   Drug-induced myopathy 04/19/2020   Hepatic steatosis 04/19/2020   Bradly Chris PT, DPT  05/29/2021, 12:49 PM  [place] [facility] [address], [place], [address] Phone: [phone]   Fax:  [phone]  Name: Lakia Rumrill MRN: [id number] Date of Birth: [birth year]

## 2021-06-06 ENCOUNTER — Ambulatory Visit: Payer: 59 | Attending: Orthopedic Surgery | Admitting: Physical Therapy

## 2021-06-06 DIAGNOSIS — M25511 Pain in right shoulder: Secondary | ICD-10-CM | POA: Insufficient documentation

## 2021-06-06 DIAGNOSIS — G8929 Other chronic pain: Secondary | ICD-10-CM | POA: Diagnosis present

## 2021-06-06 NOTE — Therapy (Signed)
[facility] [address], [place], [address] Phone: [phone]   Fax:  [phone]  Physical Therapy Treatment  Patient Details  Name: Holly Steele MRN: [id number] Date of Birth: October 20, 1956 Referring Provider (PT): Marcene Duos, MD.   Encounter Date: 06/06/2021   PT End of Session - 06/06/21 1151     Visit Number 2    Number of Visits 11    Date for PT Re-Evaluation 06/23/21    PT Start Time [id number]    PT Stop Time 1230    PT Time Calculation (min) 45 min    Activity Tolerance Patient tolerated treatment well;No increased pain    Behavior During Therapy [facility] for tasks assessed/performed             Past Medical History:  Diagnosis Date   Asthma    past   Atypical mole 08/14/2020   L lat buttocks    Osteoporosis     Past Surgical History:  Procedure Laterality Date   POLYPECTOMY      There were no vitals filed for this visit.   Subjective Assessment - 06/06/21 1148     Subjective Pt reports that she found relief from using the theracane.    Pertinent History 65 y.o. female who presents to the office complaining of right shoulder pain.  Patient returns for 6-week follow-up following AC joint injection on 03/07/2021.  Patient reports 100% relief of her pain at the beginning after the injection.  She does have continued relief but it is a little less than it was initially.  She now complains of no pain but a "tightness" in her shoulder.  She has some soreness in her shoulder blade superiorly on the right-hand side.  She also has some superior shoulder soreness but no severe pain that keeps her up at night any longer.  She is able to sleep on the right-hand side.  Denies any radicular pain or numbness/tingling or neck pain..    Limitations Other (comment)   Sleeping   How long can you sit comfortably? N/a    How long can you stand comfortably? N/a    How long can you walk comfortably? N/a     Diagnostic tests 02/22/21 -Dominant finding is acromioclavicular osteoarthritis with intense  marrow edema about the joint.     Intact rotator cuff with mild appearing supraspinatus and  infraspinatus tendinopathy.     Degenerated and frayed superior labrum.     Small volume of fluid in the subacromial/subdeltoid bursa compatible  with bursitis.    Patient Stated Goals Reduce pain    Currently in Pain? Yes    Pain Score 4     Pain Location Shoulder    Pain Orientation Right    Pain Descriptors / Indicators Aching    Pain Type Chronic pain    Pain Onset More than a month ago            MANUAL:  Dry Needling: (3/2) 73m .30 needles placed along the right upper and mid traps  focus to decrease increased muscular spasms and trigger points with the patient positioned in prone and prone respectively. TDN to trapezius group for increased activation, to trapezius for inhibition of trigger points. Localized twitch to all needling. Patient was educated on risks and benefits of therapy and verbally consents to PT.   THEREX:  Lower Trap Setting at [place] x 10  -mod VC to extend shoulder and to refrain from  extending at lumbar spine    Banded T's with Yellow TB  -min VC to maintain elbow extension  - mod TC for scap retraction or pinching shoulder blades   Rhomboid Door Stretch 4 x 60 sec  -VC for hip lean direction   Upper Trap Stretch 4 x 30 sec  -VC to stop using arm for added overpressure to avoid arm pain     Chin Tucks 1 x 10    Updated HEP and educated patient on changes to exercises and addition of new exercises      PT Education - 06/06/21 1150     Education Details form/technique with exercise    Person(s) Educated Patient    Methods Demonstration;Explanation;Handout    Comprehension Verbalized understanding;Returned demonstration              PT Short Term Goals - 05/29/21 1019       PT SHORT TERM GOAL #1   Title Patient will demonstrate independence and  understanding with home exercise plan.    Time 2    Period Weeks    Status New    Target Date 06/12/21               PT Long Term Goals - 05/29/21 1158       PT LONG TERM GOAL #1   Title Patient will have improved function and activity level as evidenced by an increase in FOTO score by 10 points or more.    Baseline 7/26: 53/65    Time 5    Period Weeks    Target Date 07/03/21      PT LONG TERM GOAL #2   Title Patient will increase mid trap and lower trap strength to 4/5 in order to increase scapular stability and decrease shoulder blade pain.    Baseline 7/26: R & L  3/5 Low Trap, 3+/5 Mid Trap    Time 5    Period Weeks    Status New    Target Date 07/03/21                   Plan - 06/06/21 1155     Personal Factors and Comorbidities Time since onset of injury/illness/exacerbation    Examination-Activity Limitations Sleep    Stability/Clinical Decision Making Stable/Uncomplicated    Rehab Potential Excellent    PT Frequency 2x / week    PT Duration Other (comment)   5 weeks   PT Treatment/Interventions Cryotherapy;Moist Heat;Manual techniques;Dry needling;Passive range of motion;Joint Manipulations;Therapeutic exercise;Therapeutic activities;Electrical Stimulation    PT Next Visit Plan Soft tissue trigger point release mid and upper traps. Progress Periscapular Stretching and Stretching    PT Home Exercise Plan [email or web address]    Consulted and Agree with Plan of Care Patient            HEP includes:  Access Code: [email or web address] URL: [email or web address] Date: 06/06/2021 Prepared by: Bradly Chris  Exercises Seated Gentle Upper Trapezius Stretch - 1 x daily - 7 x weekly - 1 sets - 5 reps - 60 hold Doorway Rhomboid Stretch - 1 x daily - 7 x weekly - 1 sets - 5 reps - 60 hold Seated Shoulder Row with Anchored Resistance - 1 x daily - 3 x weekly - 2 sets - 10 reps Low Trap Setting at [place] 1 x daily - 3 x weekly - 2 sets - 10 reps Standing  Shoulder Horizontal Abduction with Resistance - 1 x daily - 3 x weekly - 4 sets -  5 reps Seated Cervical Retraction - 1 x daily - 3 x weekly - 3 sets - 10 reps - 2 hold    Patient will benefit from skilled therapeutic intervention in order to improve the following deficits and impairments:  Pain, Decreased strength  Visit Diagnosis: No diagnosis found.     Problem List Patient Active Problem List   Diagnosis Date Noted   Familial hypercholesterolemia 05/17/2020   CKD (chronic kidney disease), stage III ([place]) 05/15/2020   Diabetic nephropathy associated with type 2 diabetes mellitus ([place]) 05/15/2020   Rash and nonspecific skin eruption 05/05/2020   Vitamin B12 deficiency 04/19/2020   Controlled type 2 diabetes mellitus with diabetic nephropathy ([place]) 04/19/2020   Asthma in adult 04/19/2020   Hyperlipidemia associated with type 2 diabetes mellitus ([place]) 04/19/2020   Drug-induced myopathy 04/19/2020   Hepatic steatosis 04/19/2020   Bradly Chris PT, DPT  06/06/2021, 12:22 PM  [facility] [address], [place], [address] Phone: [phone]   Fax:  [phone]  Name: Holly Steele MRN: [id number] Date of Birth: 22-Feb-1956

## 2021-06-12 ENCOUNTER — Ambulatory Visit: Payer: 59 | Admitting: Physical Therapy

## 2021-06-12 ENCOUNTER — Other Ambulatory Visit: Payer: Self-pay

## 2021-06-12 ENCOUNTER — Other Ambulatory Visit: Payer: Self-pay | Admitting: Family Medicine

## 2021-06-12 DIAGNOSIS — G8929 Other chronic pain: Secondary | ICD-10-CM

## 2021-06-12 DIAGNOSIS — E785 Hyperlipidemia, unspecified: Secondary | ICD-10-CM

## 2021-06-12 DIAGNOSIS — Z0000 Encounter for general adult medical examination without abnormal findings: Secondary | ICD-10-CM

## 2021-06-12 DIAGNOSIS — E7801 Familial hypercholesterolemia: Secondary | ICD-10-CM

## 2021-06-12 DIAGNOSIS — E1169 Type 2 diabetes mellitus with other specified complication: Secondary | ICD-10-CM

## 2021-06-12 DIAGNOSIS — M25511 Pain in right shoulder: Secondary | ICD-10-CM | POA: Diagnosis not present

## 2021-06-12 DIAGNOSIS — F5101 Primary insomnia: Secondary | ICD-10-CM

## 2021-06-12 DIAGNOSIS — E1121 Type 2 diabetes mellitus with diabetic nephropathy: Secondary | ICD-10-CM

## 2021-06-12 NOTE — Therapy (Signed)
[facility] [address], [place], [address] Phone: [phone]   Fax:  [phone]  Physical Therapy Treatment  Patient Details  Name: Holly Steele MRN: [id number] Date of Birth: Apr 28, 1956 Referring Provider (PT): Marcene Duos, MD.   Encounter Date: 06/12/2021   PT End of Session - 06/12/21 1550     Visit Number 3    Number of Visits 11    Date for PT Re-Evaluation 06/23/21    PT Start Time [id number]    PT Stop Time 1630    PT Time Calculation (min) 45 min    Activity Tolerance Patient tolerated treatment well;No increased pain    Behavior During Therapy [facility] for tasks assessed/performed             Past Medical History:  Diagnosis Date   Asthma    past   Atypical mole 08/14/2020   L lat buttocks    Osteoporosis     Past Surgical History:  Procedure Laterality Date   POLYPECTOMY      There were no vitals filed for this visit.   Subjective Assessment - 06/12/21 1549     Subjective Pt reports that her muscles are starting to recover dry needling and that exercises are going well.    Pertinent History 65 y.o. female who presents to the office complaining of right shoulder pain.  Patient returns for 6-week follow-up following AC joint injection on 03/07/2021.  Patient reports 100% relief of her pain at the beginning after the injection.  She does have continued relief but it is a little less than it was initially.  She now complains of no pain but a "tightness" in her shoulder.  She has some soreness in her shoulder blade superiorly on the right-hand side.  She also has some superior shoulder soreness but no severe pain that keeps her up at night any longer.  She is able to sleep on the right-hand side.  Denies any radicular pain or numbness/tingling or neck pain..    Limitations Other (comment)   Sleeping   How long can you sit comfortably? N/a    How long can you stand comfortably? N/a    How long  can you walk comfortably? 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     Intact rotator cuff with mild appearing supraspinatus and  infraspinatus tendinopathy.     Degenerated and frayed superior labrum.     Small volume of fluid in the subacromial/subdeltoid bursa compatible  with bursitis.    Patient Stated Goals Reduce pain    Currently in Pain? No/denies    Pain Onset More than a month ago             THEREX:   Serratus Punches 1 x 15  -TC for external cue to punch arms   D2 Flexion Yellow Theraband 2 x 10  -VC for sequence of exercise   Educated on soreness rules and what to expect from delayed onset muscles soreness.    Updated HEP and educated patient on changes to exercises and addition of new exercises      PT Education - 06/12/21 1550     Education Details form/technique with exercise    Person(s) Educated Patient    Methods Explanation;Verbal cues;Demonstration;Handout    Comprehension Verbalized understanding;Returned demonstration;Tactile cues required;Verbal cues required  PT Short Term Goals - 06/06/21 1233       PT SHORT TERM GOAL #1   Title Patient will demonstrate independence and understanding with home exercise plan.    Time 2    Period Weeks    Status New    Target Date 06/12/21               PT Long Term Goals - 06/06/21 1233       PT LONG TERM GOAL #1   Title Patient will have improved function and activity level as evidenced by an increase in FOTO score by 10 points or more.    Baseline 7/26: 53/65    Time 5    Period Weeks      PT LONG TERM GOAL #2   Title Patient will increase mid trap and lower trap strength to 4/5 in order to increase scapular stability and decrease shoulder blade pain.    Baseline 7/26: R & L  3/5 Low Trap, 3+/5 Mid Trap    Time 5    Period Weeks    Status New                   Plan -  06/12/21 1551     Personal Factors and Comorbidities Time since onset of injury/illness/exacerbation    Examination-Activity Limitations Sleep    Stability/Clinical Decision Making Stable/Uncomplicated    Rehab Potential Excellent    PT Frequency 2x / week    PT Duration Other (comment)   5 weeks   PT Treatment/Interventions Cryotherapy;Moist Heat;Manual techniques;Dry needling;Passive range of motion;Joint Manipulations;Therapeutic exercise;Therapeutic activities;Electrical Stimulation    PT Next Visit Plan Soft tissue trigger point release mid and upper traps. Progress Periscapular Stretching and Stretching    PT Home Exercise Plan [email or web address]    Consulted and Agree with Plan of Care Patient            HEP includes:   Access Code: [email or web address] URL: [email or web address] Date: 06/12/2021 Prepared by: Bradly Chris  Exercises Seated Gentle Upper Trapezius Stretch - 1 x daily - 7 x weekly - 1 sets - 5 reps - 60 hold Doorway Rhomboid Stretch - 1 x daily - 7 x weekly - 1 sets - 5 reps - 60 hold Standing Shoulder Single Arm PNF D2 Flexion with Resistance - 1 x daily - 3 x weekly - 2 sets - 10 reps Supine Scapular Protraction in Flexion with Dumbbells - 1 x daily - 3 x weekly - 2 sets - 15 reps - 2 hold Seated Shoulder Row with Anchored Resistance - 1 x daily - 3 x weekly - 2 sets - 10 reps Seated Cervical Retraction - 1 x daily - 3 x weekly - 3 sets - 10 reps - 2 hold    Patient will benefit from skilled therapeutic intervention in order to improve the following deficits and impairments:  Pain, Decreased strength  Visit Diagnosis: 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     Problem List Patient Active Problem List   Diagnosis Date Noted   Familial hypercholesterolemia 05/17/2020   CKD (chronic kidney disease), stage III ([place]) 05/15/2020   Diabetic nephropathy associated with type 2 diabetes mellitus ([place]) 05/15/2020   Rash and nonspecific skin eruption 05/05/2020    Vitamin B12 deficiency 04/19/2020   Controlled type 2 diabetes mellitus with diabetic nephropathy ([place]) 04/19/2020   Asthma in adult 04/19/2020   Hyperlipidemia associated with type 2 diabetes mellitus ([place]) 04/19/2020   Drug-induced myopathy 04/19/2020  Hepatic steatosis 04/19/2020   Bradly Chris PT, DPT  06/12/2021, 4:13 PM  [place] [facility] [address] [place], [address] Phone: [phone]   Fax:  [phone]  Name: Holly Steele MRN: [id number] Date of Birth: Jan 18, 1956

## 2021-06-12 NOTE — Telephone Encounter (Signed)
Requested Prescriptions  Pending Prescriptions Disp Refills  . traZODone (DESYREL) 100 MG tablet [Pharmacy Med Name: TRAZODONE TAB '100MG'$ ] 90 tablet 0    Sig: TAKE 1 TABLET AT BEDTIME     Psychiatry: Antidepressants - Serotonin Modulator Passed - 06/12/2021  9:41 PM      Passed - Valid encounter within last 6 months    Recent Outpatient Visits          5 months ago Controlled type 2 diabetes mellitus with diabetic nephropathy, without long-term current use of insulin ([place])   [facility], Devonne Doughty, DO   8 months ago Type 2 diabetes mellitus with hyperglycemia, without long-term current use of insulin [facility])   [facility], Devonne Doughty, DO   1 year ago Type 2 diabetes mellitus with hyperglycemia, without long-term current use of insulin [facility])   [facility] [place] [facility], Devonne Doughty, DO   1 year ago [facility], Lupita Raider, FNP   1 year ago Type 2 diabetes mellitus with hyperglycemia, without long-term current use of insulin [facility])   [facility] [place], Devonne Doughty, DO      Future Appointments            In 1 week [facility], Devonne Doughty, DO [facility] [place]   In 2 weeks Am[facility] Hailey, Nyoka Lint, MD [facility]

## 2021-06-13 ENCOUNTER — Ambulatory Visit: Payer: 59 | Admitting: Physical Therapy

## 2021-06-13 ENCOUNTER — Other Ambulatory Visit: Payer: 59

## 2021-06-14 ENCOUNTER — Ambulatory Visit: Payer: 59 | Admitting: Physical Therapy

## 2021-06-14 DIAGNOSIS — M25511 Pain in right shoulder: Secondary | ICD-10-CM

## 2021-06-14 DIAGNOSIS — G8929 Other chronic pain: Secondary | ICD-10-CM

## 2021-06-14 LAB — COMPLETE METABOLIC PANEL WITH GFR
AG Ratio: 2 (calc) (ref 1.0–2.5)
ALT: 18 U/L (ref 6–29)
AST: 20 U/L (ref 10–35)
Albumin: 4.5 g/dL (ref 3.6–5.1)
Alkaline phosphatase (APISO): 61 U/L (ref 37–153)
BUN/Creatinine Ratio: 18 (calc) (ref 6–22)
BUN: 21 mg/dL (ref 7–25)
CO2: 28 mmol/L (ref 20–32)
Calcium: 9.8 mg/dL (ref 8.6–10.4)
Chloride: 105 mmol/L (ref 98–110)
Creat: 1.15 mg/dL — ABNORMAL HIGH (ref 0.50–1.05)
Globulin: 2.3 g/dL (calc) (ref 1.9–3.7)
Glucose, Bld: 132 mg/dL — ABNORMAL HIGH (ref 65–99)
Potassium: 4.1 mmol/L (ref 3.5–5.3)
Sodium: 139 mmol/L (ref 135–146)
Total Bilirubin: 0.5 mg/dL (ref 0.2–1.2)
Total Protein: 6.8 g/dL (ref 6.1–8.1)
eGFR: 54 mL/min/{1.73_m2} — ABNORMAL LOW (ref >=60)

## 2021-06-14 LAB — TSH: TSH: 2.67 mIU/L (ref 0.40–4.50)

## 2021-06-14 LAB — HEMOGLOBIN A1C
Hgb A1c MFr Bld: 6.7 % of total Hgb — ABNORMAL HIGH (ref <5.7)
Mean Plasma Glucose: 146 mg/dL
eAG (mmol/L): 8.1 mmol/L

## 2021-06-14 LAB — CBC WITH DIFFERENTIAL/PLATELET
Absolute Monocytes: 432 cells/uL (ref 200–950)
Basophils Absolute: 51 cells/uL (ref 0–200)
Basophils Relative: 1.1 %
Eosinophils Absolute: 129 cells/uL (ref 15–500)
Eosinophils Relative: 2.8 %
HCT: 43.8 % (ref 35.0–45.0)
Hemoglobin: 14.2 g/dL (ref 11.7–15.5)
Lymphs Abs: 1467 cells/uL (ref 850–3900)
MCH: 29.6 pg (ref 27.0–33.0)
MCHC: 32.4 g/dL (ref 32.0–36.0)
MCV: 91.3 fL (ref 80.0–100.0)
MPV: 10.9 fL (ref 7.5–12.5)
Monocytes Relative: 9.4 %
Neutro Abs: 2521 cells/uL (ref 1500–7800)
Neutrophils Relative %: 54.8 %
Platelets: 267 10*3/uL (ref 140–400)
RBC: 4.8 10*6/uL (ref 3.80–5.10)
RDW: 12.1 % (ref 11.0–15.0)
Total Lymphocyte: 31.9 %
WBC: 4.6 10*3/uL (ref 3.8–10.8)

## 2021-06-14 LAB — LIPID PANEL
Cholesterol: 143 mg/dL (ref <200)
HDL: 50 mg/dL (ref >=50)
LDL Cholesterol (Calc): 74 mg/dL (calc)
Non-HDL Cholesterol (Calc): 93 mg/dL (calc) (ref <130)
Total CHOL/HDL Ratio: 2.9 (calc) (ref <5.0)
Triglycerides: 104 mg/dL (ref <150)

## 2021-06-14 NOTE — Therapy (Signed)
[facility] [address], [place], [address] Phone: [phone]   Fax:  [phone]  Physical Therapy Treatment  Patient Details  Name: Holly Steele MRN: [id number] Date of Birth: 11/25/1955 Referring Provider (PT): Marcene Duos, MD.   Encounter Date: 06/14/2021   PT End of Session - 06/14/21 1551     Visit Number 4    Number of Visits 11    Date for PT Re-Evaluation 06/23/21    PT Start Time [id number]    PT Stop Time [id number]    PT Time Calculation (min) 45 min    Activity Tolerance Patient tolerated treatment well;No increased pain    Behavior During Therapy [facility] for tasks assessed/performed             Past Medical History:  Diagnosis Date   Asthma    past   Atypical mole 08/14/2020   L lat buttocks    Osteoporosis     Past Surgical History:  Procedure Laterality Date   POLYPECTOMY      There were no vitals filed for this visit.   Subjective Assessment - 06/14/21 1552     Subjective Pt reports not being able to do exercises yesterday because she received a deep tissue massage.    Pertinent History 65 y.o. female who presents to the office complaining of right shoulder pain.  Patient returns for 6-week follow-up following AC joint injection on 03/07/2021.  Patient reports 100% relief of her pain at the beginning after the injection.  She does have continued relief but it is a little less than it was initially.  She now complains of no pain but a "tightness" in her shoulder.  She has some soreness in her shoulder blade superiorly on the right-hand side.  She also has some superior shoulder soreness but no severe pain that keeps her up at night any longer.  She is able to sleep on the right-hand side.  Denies any radicular pain or numbness/tingling or neck pain..    Limitations Other (comment)   Sleeping   How long can you sit comfortably? N/a    How long can you stand comfortably? N/a    How long can  you walk comfortably? 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     Intact rotator cuff with mild appearing supraspinatus and  infraspinatus tendinopathy.     Degenerated and frayed superior labrum.     Small volume of fluid in the subacromial/subdeltoid bursa compatible  with bursitis.    Patient Stated Goals Reduce pain    Pain Onset More than a month ago            THEREX:  General discussion about overall health and weightbearing exercise to improve osteopenia.   D2 Flexion with YTB 3 X 10   Seated Rows with #15 2 x 10  -min TC with hand between shoulder blades for shoulder squeeze   Serratus Punches 3 x 10  -Mod TC for external cue to punch too   Updated HEP and educated patient on changes to exercises and addition of new exercises     PT Education - 06/14/21 1551     Education Details form/technique with exercise    Person(s) Educated Patient    Methods Explanation;Demonstration;Verbal cues;Handout    Comprehension Verbalized understanding;Returned demonstration;Verbal cues required  PT Short Term Goals - 06/14/21 1552       PT SHORT TERM GOAL #1   Title Patient will demonstrate independence and understanding with home exercise plan.    Time 2    Period Weeks    Status On-going    Target Date 06/12/21               PT Long Term Goals - 06/14/21 1603       PT LONG TERM GOAL #1   Title Patient will have improved function and activity level as evidenced by an increase in FOTO score by 10 points or more.    Baseline 7/26: 53/65    Time 5    Period Weeks      PT LONG TERM GOAL #2   Title Patient will increase mid trap and lower trap strength to 4/5 in order to increase scapular stability and decrease shoulder blade pain.    Baseline 7/26: R & L  3/5 Low Trap, 3+/5 Mid Trap    Time 5    Period Weeks    Status New                   Plan  - 06/14/21 1549     Clinical Impression Statement Pt presents for f/u for right periscapular pain. She exhibits increased periscapular strength with ability to complete an increased number of repititions for the D2 flexion exercise and serratus punches.  She will continue to benefit from PT to progress periscapular strength and to decrease periscapular pain in order to comfortably sleep at night without being awoken by pain.    Personal Factors and Comorbidities Time since onset of injury/illness/exacerbation    Examination-Activity Limitations Sleep    Stability/Clinical Decision Making Stable/Uncomplicated    Rehab Potential Excellent    PT Frequency 2x / week    PT Duration Other (comment)   5 weeks   PT Treatment/Interventions Cryotherapy;Moist Heat;Manual techniques;Dry needling;Passive range of motion;Joint Manipulations;Therapeutic exercise;Therapeutic activities;Electrical Stimulation    PT Next Visit Plan Progress periscapular strengthening. Omega machine and soft tissue work    [place] and Agree with Plan of Care Patient            HEP includes:  Access Code: [email or web address] URL: [email or web address] Date: 06/14/2021 Prepared by: Bradly Chris  Exercises Seated Gentle Upper Trapezius Stretch - 1 x daily - 7 x weekly - 1 sets - 5 reps - 60 hold Doorway Rhomboid Stretch - 1 x daily - 7 x weekly - 1 sets - 5 reps - 60 hold Standing Shoulder Single Arm PNF D2 Flexion with Resistance - 1 x daily - 3 x weekly - 3 sets - 10 reps Supine Scapular Protraction in Flexion with Dumbbells - 1 x daily - 3 x weekly - 3 sets - 10 reps - 2 hold Seated Shoulder Row with Anchored Resistance - 1 x daily - 3 x weekly - 3 sets - 10 reps Seated Cervical Retraction - 1 x daily - 3 x weekly - 3 sets - 10 reps - 2 hold   Patient will benefit from skilled therapeutic intervention in order to improve the following deficits and impairments:  Pain, Decreased  strength  Visit Diagnosis: 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     Problem List Patient Active Problem List   Diagnosis Date Noted   Familial hypercholesterolemia 05/17/2020   CKD (chronic kidney disease), stage III ([place]) 05/15/2020   Diabetic nephropathy  associated with type 2 diabetes mellitus ([place]) 05/15/2020   Rash and nonspecific skin eruption 05/05/2020   Vitamin B12 deficiency 04/19/2020   Controlled type 2 diabetes mellitus with diabetic nephropathy ([place]) 04/19/2020   Asthma in adult 04/19/2020   Hyperlipidemia associated with type 2 diabetes mellitus ([place]) 04/19/2020   Drug-induced myopathy 04/19/2020   Hepatic steatosis 04/19/2020   Bradly Chris PT, DPT  06/14/2021, 4:37 PM  [place] [address] [place], [address] Phone: [phone]   Fax:  [phone]  Name: Holly Steele MRN: [id number] Date of Birth: December 18, 1955

## 2021-06-18 ENCOUNTER — Encounter: Payer: 59 | Admitting: Physical Therapy

## 2021-06-19 ENCOUNTER — Ambulatory Visit: Payer: 59 | Admitting: Physical Therapy

## 2021-06-19 DIAGNOSIS — G8929 Other chronic pain: Secondary | ICD-10-CM

## 2021-06-19 NOTE — Therapy (Signed)
[facility] [address] [place], [address] Phone: [phone]   Fax:  [phone]  Physical Therapy Treatment  Patient Details  Name: Holly Steele MRN: [id number] Date of Birth: 12/14/1955 Referring Provider (PT): Marcene Duos, MD.   Encounter Date: 06/19/2021   PT End of Session - 06/19/21 1228     Visit Number 5    Number of Visits 11    Date for PT Re-Evaluation 06/23/21    PT Start Time 1150    PT Stop Time [id number]    PT Time Calculation (min) 45 min    Activity Tolerance Patient tolerated treatment well;No increased pain    Behavior During Therapy [facility] for tasks assessed/performed             Past Medical History:  Diagnosis Date   Asthma    past   Atypical mole 08/14/2020   L lat buttocks    Osteoporosis     Past Surgical History:  Procedure Laterality Date   POLYPECTOMY      There were no vitals filed for this visit.   Subjective Assessment - 06/19/21 1158     Subjective Pt reports that she is still having difficulty sleeping because of right rhomboid pain but that she has been able to do all her exercises without any issues.    Pertinent History 65 y.o. female who presents to the office complaining of right shoulder pain.  Patient returns for 6-week follow-up following AC joint injection on 03/07/2021.  Patient reports 100% relief of her pain at the beginning after the injection.  She does have continued relief but it is a little less than it was initially.  She now complains of no pain but a "tightness" in her shoulder.  She has some soreness in her shoulder blade superiorly on the right-hand side.  She also has some superior shoulder soreness but no severe pain that keeps her up at night any longer.  She is able to sleep on the right-hand side.  Denies any radicular pain or numbness/tingling or neck pain..    Limitations Other (comment)   Sleeping   How long can you sit comfortably?  N/a    How long can you stand comfortably? N/a    How long can you walk comfortably? 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     Intact rotator cuff with mild appearing supraspinatus and  infraspinatus tendinopathy.     Degenerated and frayed superior labrum.     Small volume of fluid in the subacromial/subdeltoid bursa compatible  with bursitis.    Patient Stated Goals Reduce pain    Currently in Pain? No/denies    Pain Onset --            MANUAL THERAPY   Right Rhomboid tender to palpate  Right Rhomboid Trigger Point Release in prone with right shoulder ER/IR   THEREX  Band T's 2 x 10 with green theraband  -VC to maintain elbow extension   Omega Face Pulls #5 2 x 10  -min VC to bring to face   Omega Lat Pull downs #25 2 x 10   Omega Seated Shoulder Rows #25 1 x 10   Updated HEP and educated patient on changes to exercises and addition of new exercises       PT Education - 06/19/21 1213     Education Details form/technique with  exercise    Person(s) Educated Patient    Methods Explanation;Demonstration;Handout;Verbal cues    Comprehension Verbalized understanding;Returned demonstration              PT Short Term Goals - 06/19/21 1902       PT SHORT TERM GOAL #1   Title Patient will demonstrate independence and understanding with home exercise plan.    Time 2    Period Weeks    Status On-going    Target Date 06/12/21               PT Long Term Goals - 06/19/21 1902       PT LONG TERM GOAL #1   Title Patient will have improved function and activity level as evidenced by an increase in FOTO score by 10 points or more.    Baseline 7/26: 53/65    Time 5    Period Weeks      PT LONG TERM GOAL #2   Title Patient will increase mid trap and lower trap strength to 4/5 in order to increase scapular stability and decrease shoulder blade pain.    Baseline 7/26: R  & L  3/5 Low Trap, 3+/5 Mid Trap    Time 5    Period Weeks    Status New                   Plan - 06/19/21 1852     Clinical Impression Statement Pt presents for f/u for right periscapular pain. She demonstrates improved right shoulder strength with ability to perform shoulder and periscapular exercises with increased resistance using OMEGA machine. Pt benefit from right trigger point release with decrease in right rhomboid pain and increase in tissue extensibility. She will benefit from skilled PT to increase periscapular strength and decrease right rhomboid pain to sleep comfortably.    Personal Factors and Comorbidities Time since onset of injury/illness/exacerbation    Examination-Activity Limitations Sleep    Stability/Clinical Decision Making Stable/Uncomplicated    Rehab Potential Excellent    PT Frequency 2x / week    PT Duration Other (comment)   5 weeks   PT Treatment/Interventions Cryotherapy;Moist Heat;Manual techniques;Dry needling;Passive range of motion;Joint Manipulations;Therapeutic exercise;Therapeutic activities;Electrical Stimulation    PT Next Visit Plan Continued soft tissue work. Continuation of omega machine and progression of periscapular strengthening.    PT Home Exercise Plan [email or web address]    Consulted and Agree with Plan of Care Patient            HEP includes following exercises:  Access Code: [email or web address] URL: [email or web address] Date: 06/19/2021 Prepared by: Bradly Chris  Exercises Seated Gentle Upper Trapezius Stretch - 1 x daily - 7 x weekly - 1 sets - 5 reps - 60 hold Doorway Rhomboid Stretch - 1 x daily - 7 x weekly - 1 sets - 5 reps - 60 hold Standing Shoulder Single Arm PNF D2 Flexion with Resistance - 1 x daily - 3 x weekly - 3 sets - 10 reps Supine Scapular Protraction in Flexion with Dumbbells - 1 x daily - 3 x weekly - 3 sets - 10 reps - 2 hold Seated Shoulder Row with Anchored Resistance - 1 x daily - 3 x weekly - 3  sets - 10 reps Seated Cervical Retraction - 1 x daily - 3 x weekly - 3 sets - 10 reps - 2 hold Standing Shoulder Horizontal Abduction with Resistance - 1 x daily - 3 x weekly - 2 sets - 10 reps  Patient will benefit from skilled therapeutic intervention in order to improve the following deficits and impairments:  Pain, Decreased strength  Visit Diagnosis: 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     Problem List Patient Active Problem List   Diagnosis Date Noted   Familial hypercholesterolemia 05/17/2020   CKD (chronic kidney disease), stage III ([place]) 05/15/2020   Diabetic nephropathy associated with type 2 diabetes mellitus ([place]) 05/15/2020   Rash and nonspecific skin eruption 05/05/2020   Vitamin B12 deficiency 04/19/2020   Controlled type 2 diabetes mellitus with diabetic nephropathy ([place]) 04/19/2020   Asthma in adult 04/19/2020   Hyperlipidemia associated with type 2 diabetes mellitus ([place]) 04/19/2020   Drug-induced myopathy 04/19/2020   Hepatic steatosis 04/19/2020   Bradly Chris PT, DPT  06/19/2021, 7:04 PM  [facility] [address] [place], [address] Phone: [phone]   Fax:  [phone]  Name: Alohilani Yaldo MRN: [id number] Date of Birth: 1956/04/07

## 2021-06-20 ENCOUNTER — Encounter: Payer: Self-pay | Admitting: Family Medicine

## 2021-06-20 ENCOUNTER — Ambulatory Visit: Payer: 59 | Admitting: Family Medicine

## 2021-06-20 ENCOUNTER — Encounter: Payer: 59 | Admitting: Physical Therapy

## 2021-06-20 ENCOUNTER — Other Ambulatory Visit: Payer: Self-pay

## 2021-06-20 VITALS — BP 103/65 | HR 75 | Ht 65.0 in | Wt 125.6 lb

## 2021-06-20 DIAGNOSIS — N1831 Chronic kidney disease, stage 3a: Secondary | ICD-10-CM | POA: Diagnosis not present

## 2021-06-20 DIAGNOSIS — Z0000 Encounter for general adult medical examination without abnormal findings: Secondary | ICD-10-CM | POA: Diagnosis not present

## 2021-06-20 DIAGNOSIS — E7801 Familial hypercholesterolemia: Secondary | ICD-10-CM | POA: Diagnosis not present

## 2021-06-20 DIAGNOSIS — F5101 Primary insomnia: Secondary | ICD-10-CM

## 2021-06-20 DIAGNOSIS — E1121 Type 2 diabetes mellitus with diabetic nephropathy: Secondary | ICD-10-CM

## 2021-06-20 DIAGNOSIS — M8580 Other specified disorders of bone density and structure, unspecified site: Secondary | ICD-10-CM

## 2021-06-20 DIAGNOSIS — E1169 Type 2 diabetes mellitus with other specified complication: Secondary | ICD-10-CM

## 2021-06-20 DIAGNOSIS — E785 Hyperlipidemia, unspecified: Secondary | ICD-10-CM

## 2021-06-20 DIAGNOSIS — E78019 Familial hypercholesterolemia, unspecified: Secondary | ICD-10-CM

## 2021-06-20 LAB — POCT UA - MICROALBUMIN: Microalbumin Ur, POC: 20 mg/L

## 2021-06-20 MED ORDER — ALENDRONATE SODIUM 70 MG PO TABS: 70.0000 mg | ORAL_TABLET | ORAL | 3 refills | Status: DC

## 2021-06-20 MED ORDER — OZEMPIC (1 MG/DOSE) 4 MG/3ML ~~LOC~~ SOPN: 1.0000 mg | PEN_INJECTOR | SUBCUTANEOUS | 3 refills | Status: DC

## 2021-06-20 MED ORDER — JARDIANCE 25 MG PO TABS: 25.0000 mg | ORAL_TABLET | Freq: Every day | ORAL | 3 refills | Status: DC

## 2021-06-20 MED ORDER — TRAZODONE HCL 100 MG PO TABS: 100.0000 mg | ORAL_TABLET | Freq: Every day | ORAL | 3 refills | Status: DC

## 2021-06-20 NOTE — Assessment & Plan Note (Signed)
Continue on PCSK9 therapy

## 2021-06-20 NOTE — Assessment & Plan Note (Signed)
Secondary to DM2 see A&P Last urine microalbumin negative. Not on ACEi ARB 

## 2021-06-20 NOTE — Assessment & Plan Note (Signed)
See A&P DM Stable creatinine CKD-III

## 2021-06-20 NOTE — Progress Notes (Signed)
Subjective:    Patient ID: Holly Steele, female    DOB: 1956-07-13, 65 y.o.   MRN: [id number]  Beuna Bolding is a 65 y.o. female presenting on 06/20/2021 for Diabetes   HPI   Here for Annual Physical and Lab Review.  CHRONIC DM, Type 2 / CKD III Hyperlipidemia in T2DM Major improved overall CBGs - continuous glucose monitor, DexCom6 - refill 9 sensors (1 box 3 sensor x 3 = 90 day), 1 transmitter - previously covered by insurance Meds: Ozempic 48m weekly inj, Jardiance 240mdaily Reports good compliance. Tolerating well w/o side-effects Not on ACEi/ARB. Urine albumin was negative 04/19/20 - due today Last DM Eye [place] 05/2021 Lifestyle: - Diet Low Carb Diet, see above - Now going to gym more and walking Denies hypoglycemia, polyuria, visual changes, numbness or tingling.    Familial Hyperlipidemia / Fatty Liver History of chronic elevated cholesterol with complication of liver Last lipid panel with LDL 190 previously 05/2020 - she was started on Praluent injection, failed statins due to myalgia ([place] 20) Last lab results 06/2021 still major improvement total cholesterol, LDL down to 74, total 143, HDL 50, and TG 104 On PCSK9 She is using Praluent injection 7559mvery 14 days (2 weeks) with great success - tolerating well, easy to use.    Insomnia Primary problem On Trazodone, helpful but still has issues with daytime sleepiness at times and will have some difficulty with waking up at night. Now retired doesn't have strict schedule. She does feel Trazodone is helpful but may not be satisfactory. Still dealing with waking up overnight.  Chronic Periscapular Pain Followed by Orthopedics, has some symptoms of R shoulder/neck. Improved with cortisone injection. But then weeks later unresolved. Now on PT improved.      Health Maintenance:   UTD COVID Vaccine x 2 and booster x 2. Completed.  Colon CA Screening: Cologuard completed, 04/27/20 - negative result. Next due  2024.   UTD Pap Smear next scheduled for 06/28/21 -- 05/09/20 negative. Next due 2024.   Mammogram 06/16/20. Negative. Upcoming Mammogram scheduled.  Upcoming Prevnar 22 at age [age]Depression screen PHQ 2/9 06/20/2021 09/19/2020 05/17/2020  Decreased Interest 0 0 0  Down, Depressed, Hopeless 0 0 0  PHQ - 2 Score 0 0 0  Altered sleeping 2 - 0  Tired, decreased energy 0 - 0  Change in appetite 0 - 0  Feeling bad or failure about yourself  0 - 0  Trouble concentrating 0 - 0  Moving slowly or fidgety/restless 0 - 0  Suicidal thoughts 0 - 0  PHQ-9 Score 2 - 0  Difficult doing work/chores Not difficult at all - Not difficult at all    Social History   Tobacco Use   Smoking status: Never   Smokeless tobacco: Never  Substance Use Topics   Alcohol use: Yes    Comment: Social    Drug use: Never    Review of Systems  Constitutional:  Negative for activity change, appetite change, chills, diaphoresis, fatigue and fever.  HENT:  Negative for congestion and hearing loss.   Eyes:  Negative for visual disturbance.  Respiratory:  Negative for cough, chest tightness, shortness of breath and wheezing.   Cardiovascular:  Negative for chest pain, palpitations and leg swelling.  Gastrointestinal:  Negative for abdominal pain, constipation, diarrhea, nausea and vomiting.  Genitourinary:  Negative for dysuria, frequency and hematuria.  Musculoskeletal:  Negative for arthralgias and neck pain.  Skin:  Negative for rash.  Allergic/Immunologic: Negative for environmental allergies.  Neurological:  Negative for dizziness, weakness, light-headedness, numbness and headaches.  Hematological:  Negative for adenopathy.  Psychiatric/Behavioral:  Negative for behavioral problems, dysphoric mood and sleep disturbance.   Per HPI unless specifically indicated above     Objective:    BP 103/65   Pulse 75   Ht 5' 5"  (1.651 m)   Wt 125 lb 9.6 oz (57 kg)   SpO2 99%   BMI 20.90 kg/m   Wt Readings from  Last 3 Encounters:  06/20/21 125 lb 9.6 oz (57 kg)  12/20/20 131 lb 3.2 oz (59.5 kg)  09/19/20 137 lb (62.1 kg)    Physical Exam Vitals and nursing note reviewed.  Constitutional:      General: She is not in acute distress.    Appearance: She is well-developed. She is not diaphoretic.     Comments: Well-appearing, comfortable, cooperative  HENT:     Head: Normocephalic and atraumatic.  Eyes:     General:        Right eye: No discharge.        Left eye: No discharge.     Conjunctiva/sclera: Conjunctivae normal.     Pupils: Pupils are equal, round, and reactive to light.  Neck:     Thyroid: No thyromegaly.     Vascular: No carotid bruit.  Cardiovascular:     Rate and Rhythm: Normal rate and regular rhythm.     Pulses: Normal pulses.     Heart sounds: Normal heart sounds. No murmur heard. Pulmonary:     Effort: Pulmonary effort is normal. No respiratory distress.     Breath sounds: Normal breath sounds. No wheezing or rales.  Abdominal:     General: Bowel sounds are normal. There is no distension.     Palpations: Abdomen is soft. There is no mass.     Tenderness: There is no abdominal tenderness.  Musculoskeletal:        General: No tenderness. Normal range of motion.     Cervical back: Normal range of motion and neck supple.     Right lower leg: No edema.     Left lower leg: No edema.     Comments: Upper / Lower Extremities: - Normal muscle tone, strength bilateral upper extremities 5/5, lower extremities 5/5  Lymphadenopathy:     Cervical: No cervical adenopathy.  Skin:    General: Skin is warm and dry.     Findings: No erythema or rash.  Neurological:     Mental Status: She is alert and oriented to person, place, and time.     Comments: Distal sensation intact to light touch all extremities  Psychiatric:        Mood and Affect: Mood normal.        Behavior: Behavior normal.        Thought Content: Thought content normal.     Comments: Well groomed, good eye  contact, normal speech and thoughts    Diabetic Foot Exam - Simple   Simple Foot Form Diabetic Foot exam was performed with the following findings: Yes 06/20/2021  9:08 AM  Visual Inspection No deformities, no ulcerations, no other skin breakdown bilaterally: Yes Sensation Testing Intact to touch and monofilament testing bilaterally: Yes Pulse Check Posterior Tibialis and Dorsalis pulse intact bilaterally: Yes Comments     Results for orders placed or performed in visit on 06/12/21  TSH  Result Value Ref Range   TSH 2.67 0.40 - 4.50 mIU/L  Lipid panel  Result Value Ref Range   Cholesterol 143 <200 mg/dL   HDL 50 > OR = 50 mg/dL   Triglycerides 104 <150 mg/dL   LDL Cholesterol (Calc) 74 mg/dL (calc)   Total CHOL/HDL Ratio 2.9 <5.0 (calc)   Non-HDL Cholesterol (Calc) 93 <130 mg/dL (calc)  COMPLETE METABOLIC PANEL WITH GFR  Result Value Ref Range   Glucose, Bld 132 (H) 65 - 99 mg/dL   BUN 21 7 - 25 mg/dL   Creat 1.15 (H) 0.50 - 1.05 mg/dL   eGFR 54 (L) > OR = 60 mL/min/1.4m   BUN/Creatinine Ratio 18 6 - 22 (calc)   Sodium 139 135 - 146 mmol/L   Potassium 4.1 3.5 - 5.3 mmol/L   Chloride 105 98 - 110 mmol/L   CO2 28 20 - 32 mmol/L   Calcium 9.8 8.6 - 10.4 mg/dL   Total Protein 6.8 6.1 - 8.1 g/dL   Albumin 4.5 3.6 - 5.1 g/dL   Globulin 2.3 1.9 - 3.7 g/dL (calc)   AG Ratio 2.0 1.0 - 2.5 (calc)   Total Bilirubin 0.5 0.2 - 1.2 mg/dL   Alkaline phosphatase (APISO) 61 37 - 153 U/L   AST 20 10 - 35 U/L   ALT 18 6 - 29 U/L  CBC with Differential/Platelet  Result Value Ref Range   WBC 4.6 3.8 - 10.8 Thousand/uL   RBC 4.80 3.80 - 5.10 Million/uL   Hemoglobin 14.2 11.7 - 15.5 g/dL   HCT 43.8 35.0 - 45.0 %   MCV 91.3 80.0 - 100.0 fL   MCH 29.6 27.0 - 33.0 pg   MCHC 32.4 32.0 - 36.0 g/dL   RDW 12.1 11.0 - 15.0 %   Platelets 267 140 - 400 Thousand/uL   MPV 10.9 7.5 - 12.5 fL   Neutro Abs 2,521 1,500 - 7,800 cells/uL   Lymphs Abs 1,467 850 - 3,900 cells/uL   Absolute  Monocytes 432 200 - 950 cells/uL   Eosinophils Absolute 129 15 - 500 cells/uL   Basophils Absolute 51 0 - 200 cells/uL   Neutrophils Relative % 54.8 %   Total Lymphocyte 31.9 %   Monocytes Relative 9.4 %   Eosinophils Relative 2.8 %   Basophils Relative 1.1 %  Hemoglobin A1c  Result Value Ref Range   Hgb A1c MFr Bld 6.7 (H) <5.7 % of total Hgb   Mean Plasma Glucose 146 mg/dL   eAG (mmol/L) 8.1 mmol/L   Results for orders placed or performed in visit on 06/20/21 (from the past 24 hour(s))  POCT UA - Microalbumin     Status: Abnormal   Collection Time: 06/20/21 10:06 AM  Result Value Ref Range   Microalbumin Ur, POC 20 mg/L   Creatinine, POC     Albumin/Creatinine Ratio, Urine, POC         Assessment & Plan:   Problem List Items Addressed This Visit     Primary insomnia    Primary insomnia, with some sleep maintenance problem Improved on Trazodone 1088mstill but wakes up Less likely shoulder pain waking her up Follow up when ready      Relevant Medications   traZODone (DESYREL) 100 MG tablet   Hyperlipidemia associated with type 2 diabetes mellitus (HCC)    Dramatic improvement on PCSK9 inhibitor Praluent still LDL 74 Familial hyperlipidemia, fam history and appropriate diet/lifestyle Elevated ASCVD risk Failed Rosuvastatin and prior Atorvastatin due to myalgia, drug induced myopathy  Plan: 1. Continue Praluent 7539mnjection q 2 weeks 2. Encourage improved  lifestyle - low carb/cholesterol, reduce portion size, continue improving regular exercise      Relevant Medications   OZEMPIC, 1 MG/DOSE, 4 MG/3ML SOPN   JARDIANCE 25 MG TABS tablet   Familial hypercholesterolemia    Continue on PCSK9 therapy      Diabetic nephropathy associated with type 2 diabetes mellitus ([place])    See A&P DM Stable creatinine CKD-III      Relevant Medications   OZEMPIC, 1 MG/DOSE, 4 MG/3ML SOPN   JARDIANCE 25 MG TABS tablet   Other Relevant Orders   POCT UA - Microalbumin  (Completed)   Controlled type 2 diabetes mellitus with diabetic nephropathy (HCC)    A1c controlled 6.7, dramatic improvement now well controlled Complications - CKD-III, other including hyperlipidemia - increases risk of future cardiovascular complications   Plan:  1. Continue current therapy - Ozempic 81m weekly inj, Jardiance 243mdaily 2. Encourage improved lifestyle - low carb, low sugar diet, reduce portion size, continue improving regular exercise 3. Check CBG on CGM / also OneTouch Verio for calibration and PRN check re order strips. 4. Negative microalbumin 04/2020. Not on acei/arb - next due TODAY 5. Statin intolerance - CONTINUE PCSK9 inhib Praluent 6. UTD DM Foot / Eye - request copy of DM eye report      Relevant Medications   OZEMPIC, 1 MG/DOSE, 4 MG/3ML SOPN   JARDIANCE 25 MG TABS tablet   CKD (chronic kidney disease), stage III [place]   Secondary to DM2 see A&P Last urine microalbumin negative. Not on ACEi ARB      Relevant Medications   JARDIANCE 25 MG TABS tablet   Other Relevant Orders   POCT UA - Microalbumin (Completed)   Other Visit Diagnoses     Annual physical exam    -  Primary   Osteopenia, unspecified location       Relevant Medications   alendronate (FOSAMAX) 70 MG tablet       Updated Health Maintenance information Return for TDap and Shingles shingrix Reviewed recent lab results with patient Encouraged improvement to lifestyle with diet and exercise Goal maintain healthy weight    Meds ordered this encounter  Medications   traZODone (DESYREL) 100 MG tablet    Sig: Take 1 tablet (100 mg total) by mouth at bedtime.    Dispense:  90 tablet    Refill:  3   OZEMPIC, 1 MG/DOSE, 4 MG/3ML SOPN    Sig: Inject 1 mg into the skin once a week.    Dispense:  9 mL    Refill:  3   JARDIANCE 25 MG TABS tablet    Sig: Take 1 tablet (25 mg total) by mouth daily.    Dispense:  90 tablet    Refill:  3   alendronate (FOSAMAX) 70 MG tablet    Sig:  Take 1 tablet (70 mg total) by mouth once a week.    Dispense:  12 tablet    Refill:  3      Follow up plan: Return in about 6 months (around 12/21/2021) for 6 month follow-up DM A1c, Insomnia.   AlNobie PutnamDOBancroftedical Group 06/20/2021, 8:34 AM

## 2021-06-20 NOTE — Assessment & Plan Note (Signed)
Dramatic improvement on PCSK9 inhibitor Praluent still LDL 74 Familial hyperlipidemia, fam history and appropriate diet/lifestyle Elevated ASCVD risk Failed Rosuvastatin and prior Atorvastatin due to myalgia, drug induced myopathy  Plan: 1. Continue Praluent '75mg'$  injection q 2 weeks 2. Encourage improved lifestyle - low carb/cholesterol, reduce portion size, continue improving regular exercise

## 2021-06-20 NOTE — Patient Instructions (Addendum)
Thank you for coming to the office today.  Please schedule and return for a NURSE ONLY VISIT for VACCINE - Need TDap tetanus - Need Shingrix shingles dose 1 of 2 (repeat 2-6 months)  Future Flu Shot when ready with 2nd dose shingles  Urine test today to check for protein, Microalbumin is what this one is, if you want to check into it.  Med refilled   Please schedule a Follow-up Appointment to: Return in about 6 months (around 12/21/2021) for 6 month follow-up DM A1c, Insomnia.  If you have any other questions or concerns, please feel free to call the office or send a message through [place]. You may also schedule an earlier appointment if necessary.  Additionally, you may be receiving a survey about your experience at our office within a few days to 1 week by e-mail or mail. We value your feedback.  Nobie Putnam, DO [employer]

## 2021-06-20 NOTE — Assessment & Plan Note (Signed)
A1c controlled 6.7, dramatic improvement now well controlled Complications - CKD-III, other including hyperlipidemia - increases risk of future cardiovascular complications   Plan:  1. Continue current therapy - Ozempic '1mg'$  weekly inj, Jardiance '25mg'$  daily 2. Encourage improved lifestyle - low carb, low sugar diet, reduce portion size, continue improving regular exercise 3. Check CBG on CGM / also OneTouch Verio for calibration and PRN check re order strips. 4. Negative microalbumin 04/2020. Not on acei/arb - next due TODAY 5. Statin intolerance - CONTINUE PCSK9 inhib Praluent 6. UTD DM Foot / Eye - request copy of DM eye report

## 2021-06-20 NOTE — Assessment & Plan Note (Signed)
Primary insomnia, with some sleep maintenance problem Improved on Trazodone '100mg'$  still but wakes up Less likely shoulder pain waking her up Follow up when ready

## 2021-06-21 ENCOUNTER — Ambulatory Visit: Payer: 59 | Admitting: Physical Therapy

## 2021-06-25 ENCOUNTER — Encounter: Payer: 59 | Admitting: Physical Therapy

## 2021-06-26 ENCOUNTER — Ambulatory Visit
Admission: RE | Admit: 2021-06-26 | Discharge: 2021-06-26 | Disposition: A | Discharge status: Home / Self Care | Payer: 59 | Source: Ambulatory Visit | Attending: Family Medicine | Admitting: Family Medicine

## 2021-06-26 ENCOUNTER — Other Ambulatory Visit: Payer: Self-pay

## 2021-06-26 DIAGNOSIS — Z1231 Encounter for screening mammogram for malignant neoplasm of breast: Secondary | ICD-10-CM

## 2021-06-26 IMAGING — MG MM DIGITAL SCREENING BILAT W/ TOMO AND CAD
8 series · 8 of 24 positions shown · non-contrast
Comparison: Previous exam(s).

CLINICAL DATA: Screening.

EXAM:
DIGITAL SCREENING BILATERAL MAMMOGRAM WITH TOMOSYNTHESIS AND CAD
TECHNIQUE: Bilateral screening digital craniocaudal and mediolateral oblique
mammograms were obtained. Bilateral screening digital breast
tomosynthesis was performed. The images were evaluated with
computer-aided detection.

[L MLO synth-2D]
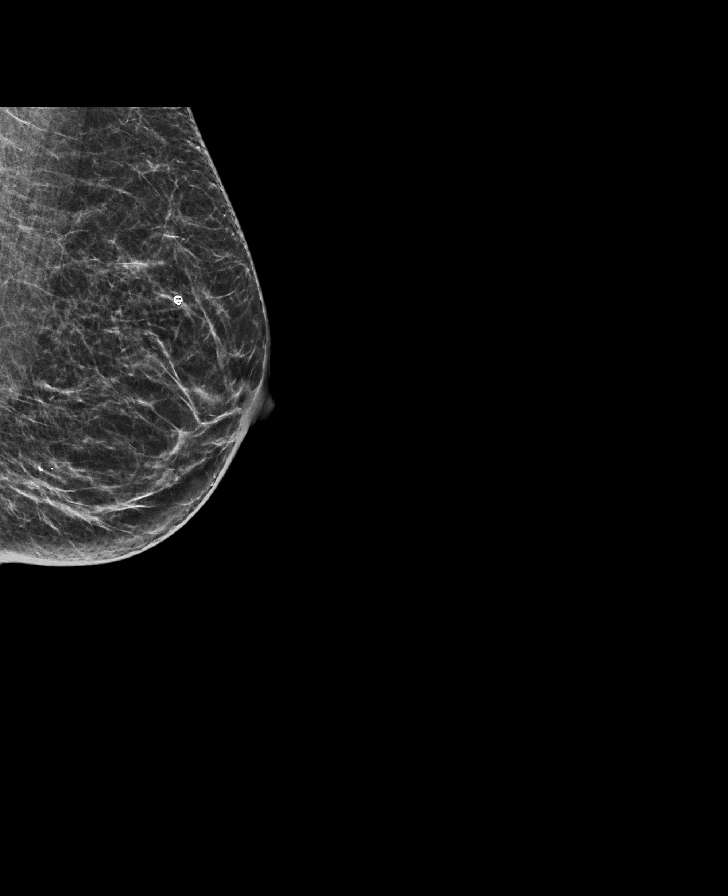

[R MLO synth-2D]
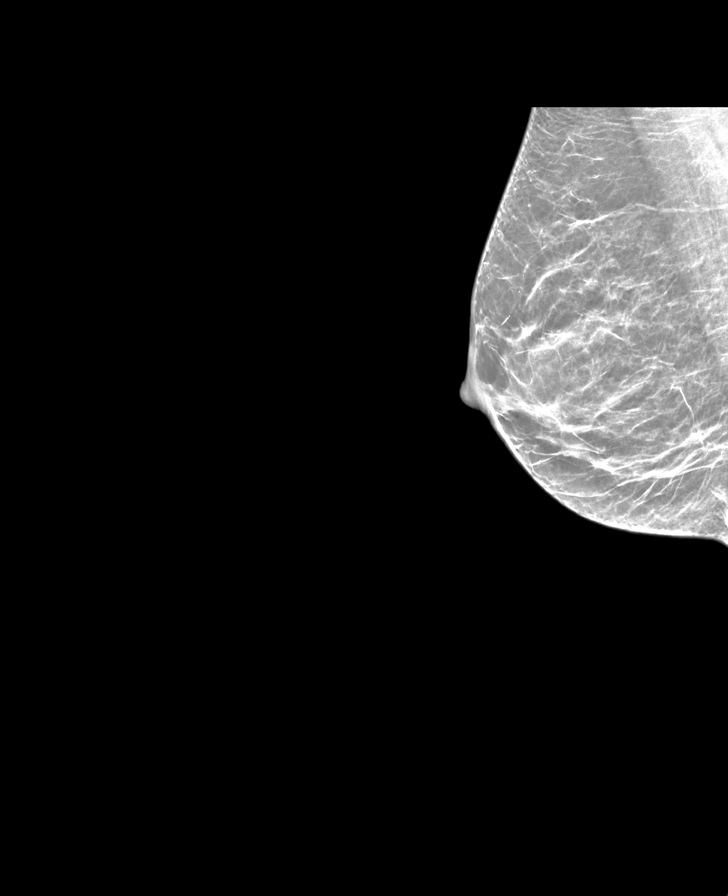

[R CC synth-2D]
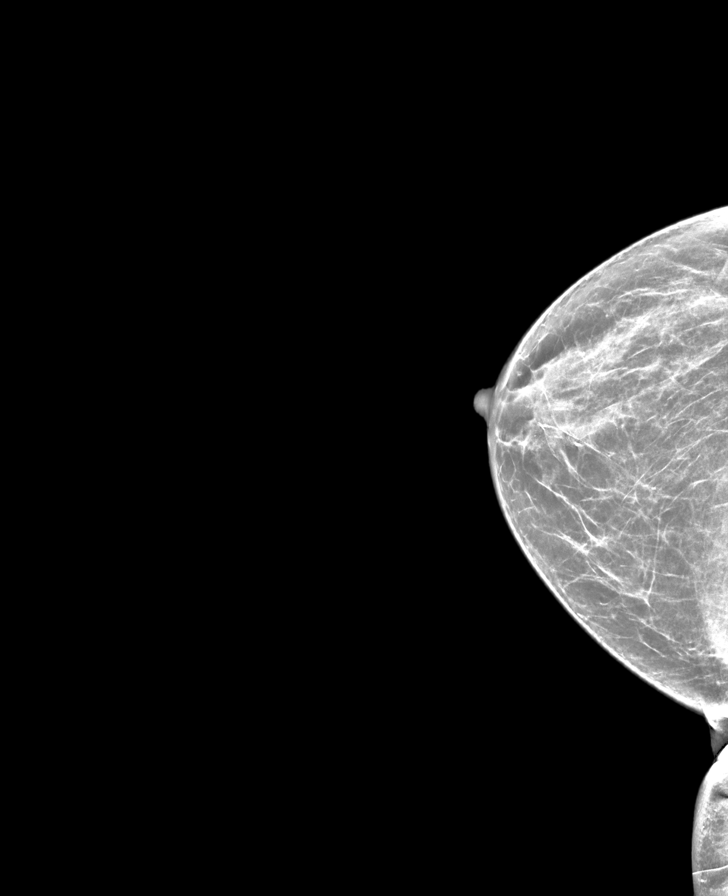

[L CC synth-2D]
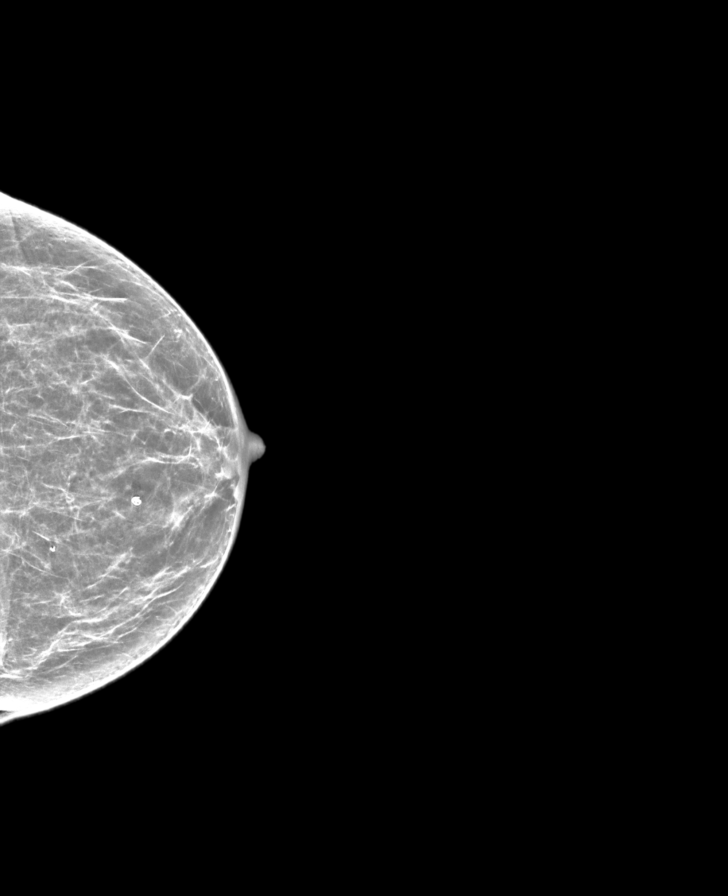

[L MLO tomo · tomo slice 27/54.0]
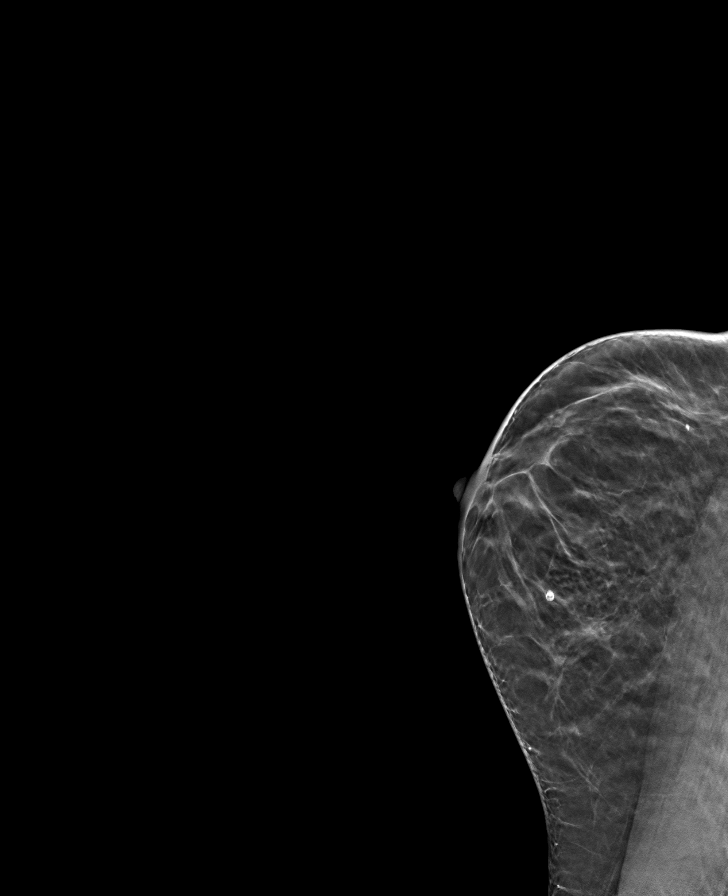

[L CC tomo · tomo slice 29/57.0]
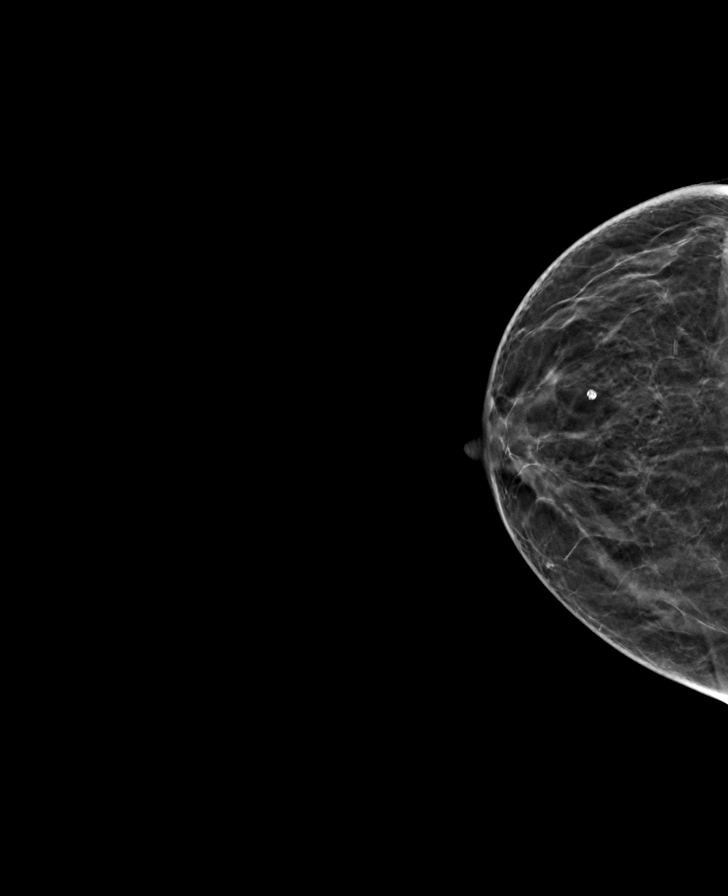

[R MLO tomo · tomo slice 28/55.0]
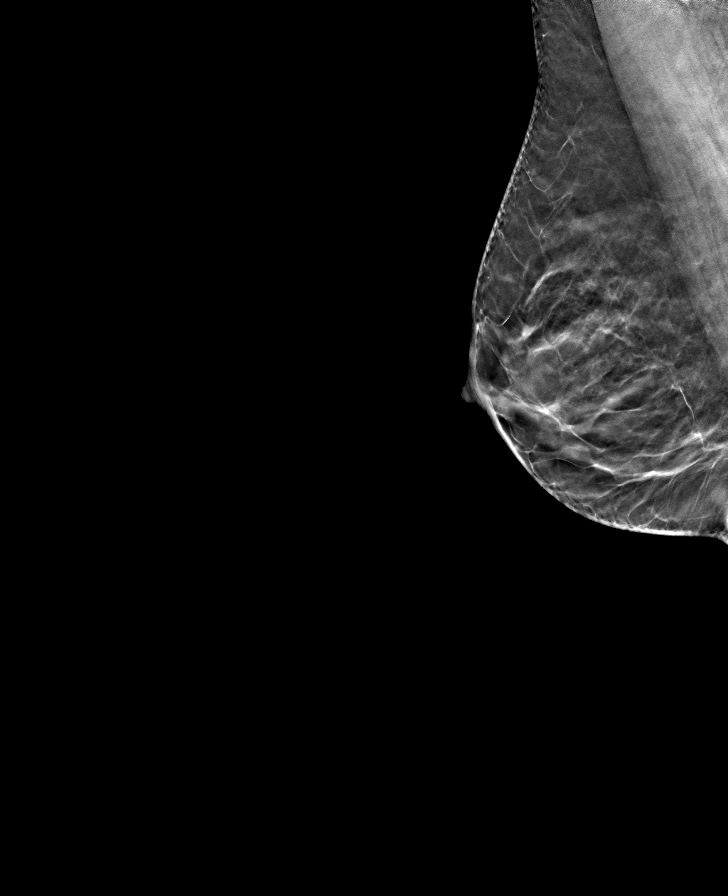

[R CC tomo · tomo slice 29/56.0]
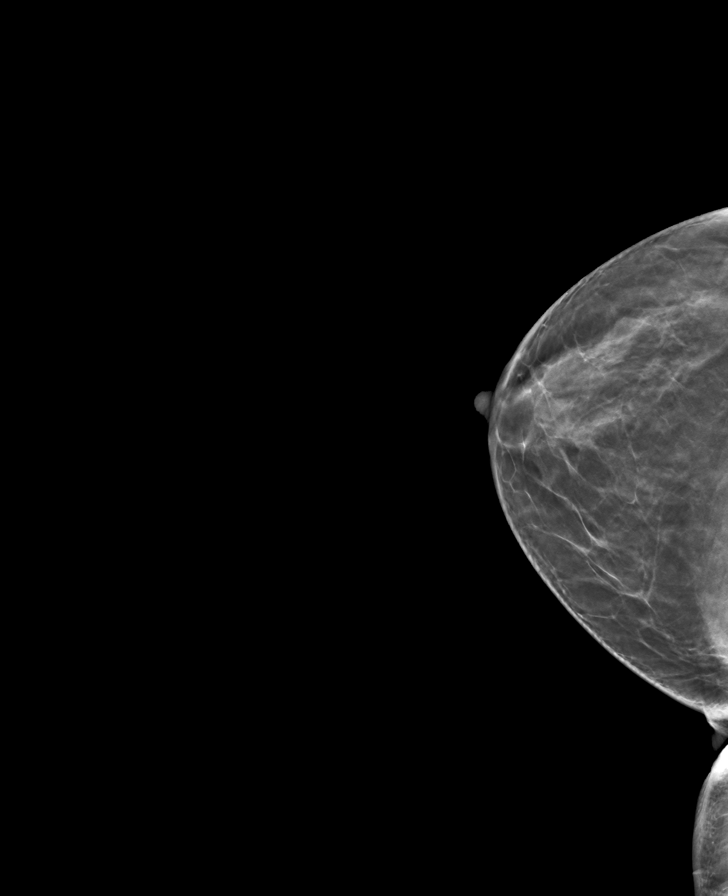

[8 of 24 positions shown; findings below may reference images not displayed]

ACR Breast Density Category b: There are scattered areas of
fibroglandular density.
FINDINGS: There are no findings suspicious for malignancy.
IMPRESSION: No mammographic evidence of malignancy. A result letter of this
screening mammogram will be mailed directly to the patient.

RECOMMENDATION:
Screening mammogram in one year. (Code:[BY])

BI-RADS CATEGORY  1: Negative.

## 2021-06-28 ENCOUNTER — Encounter: Payer: Self-pay | Admitting: Obstetrics and Gynecology

## 2021-06-28 ENCOUNTER — Ambulatory Visit (INDEPENDENT_AMBULATORY_CARE_PROVIDER_SITE_OTHER): Payer: 59 | Admitting: Obstetrics and Gynecology

## 2021-06-28 ENCOUNTER — Ambulatory Visit: Payer: 59 | Admitting: Physical Therapy

## 2021-06-28 ENCOUNTER — Other Ambulatory Visit: Payer: Self-pay

## 2021-06-28 VITALS — BP 128/88 | HR 72 | Ht 65.0 in | Wt 128.3 lb

## 2021-06-28 DIAGNOSIS — M810 Age-related osteoporosis without current pathological fracture: Secondary | ICD-10-CM | POA: Diagnosis not present

## 2021-06-28 DIAGNOSIS — M25511 Pain in right shoulder: Secondary | ICD-10-CM

## 2021-06-28 DIAGNOSIS — Z01419 Encounter for gynecological examination (general) (routine) without abnormal findings: Secondary | ICD-10-CM

## 2021-06-28 DIAGNOSIS — G8929 Other chronic pain: Secondary | ICD-10-CM

## 2021-06-28 DIAGNOSIS — N952 Postmenopausal atrophic vaginitis: Secondary | ICD-10-CM | POA: Diagnosis not present

## 2021-06-28 MED ORDER — ESTRADIOL 0.1 MG/GM VA CREA: 0.2500 | TOPICAL_CREAM | Freq: Every day | VAGINAL | 3 refills | Status: AC

## 2021-06-28 NOTE — Therapy (Signed)
[facility] [address], [place], [address] Phone: [phone]   Fax:  [phone]  Physical Therapy Treatment  Patient Details  Name: Holly Steele MRN: [id number] Date of Birth: 06-03-1956 Referring Provider (PT): Marcene Duos, MD.   Encounter Date: 06/28/2021   PT End of Session - 06/28/21 1059     Visit Number 7    Number of Visits 11    Date for PT Re-Evaluation 06/23/21    PT Start Time 1020    PT Stop Time 1100    PT Time Calculation (min) 40 min    Activity Tolerance Patient tolerated treatment well;No increased pain    Behavior During Therapy [facility] for tasks assessed/performed             Past Medical History:  Diagnosis Date   Asthma    past   Atypical mole 08/14/2020   L lat buttocks    Osteoporosis     Past Surgical History:  Procedure Laterality Date   POLYPECTOMY      There were no vitals filed for this visit.   Subjective Assessment - 06/28/21 1021     Subjective Pt reports that she is confused about her exercises and that she thought that she needed to do exercises everyday instead of every other day and as a result she feels increased soreness.    Pertinent History 65 y.o. female who presents to the office complaining of right shoulder pain.  Patient returns for 6-week follow-up following AC joint injection on 03/07/2021.  Patient reports 100% relief of her pain at the beginning after the injection.  She does have continued relief but it is a little less than it was initially.  She now complains of no pain but a "tightness" in her shoulder.  She has some soreness in her shoulder blade superiorly on the right-hand side.  She also has some superior shoulder soreness but no severe pain that keeps her up at night any longer.  She is able to sleep on the right-hand side.  Denies any radicular pain or numbness/tingling or neck pain..    Limitations Other (comment)   Sleeping   How  long can you sit comfortably? N/a    How long can you stand comfortably? N/a    How long can you walk comfortably? 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     Intact rotator cuff with mild appearing supraspinatus and  infraspinatus tendinopathy.     Degenerated and frayed superior labrum.     Small volume of fluid in the subacromial/subdeltoid bursa compatible  with bursitis.    Patient Stated Goals Reduce pain    Pain Score 2     Pain Location --   Bilaterl rhomboids   Pain Descriptors / Indicators Aching;Sore    Pain Type Acute pain            MANUAL:  Trigger Point release of bilateral rhomboids and upper trapezius   THEREX:   Triangle Pose 4 x 60 sec  -min VC and TC to prevent forward bend while performing stretch   Lat Stretch 4 x 60 sec  -min VC on where to place legs and how to rotate hips   Standing W's with YTB on single arm 2 x 10  -min VC to maintain shoulder abduction    Updated HEP and educated patient on changes to exercises and  addition of new exercises         PT Short Term Goals - 06/28/21 1254       PT SHORT TERM GOAL #1   Title Patient will demonstrate independence and understanding with home exercise plan.    Time 2    Period Weeks    Status On-going    Target Date 06/12/21               PT Long Term Goals - 06/28/21 1254       PT LONG TERM GOAL #1   Title Patient will have improved function and activity level as evidenced by an increase in FOTO score by 10 points or more.    Baseline 7/26: 53/65    Time 5    Period Weeks      PT LONG TERM GOAL #2   Title Patient will increase mid trap and lower trap strength to 4/5 in order to increase scapular stability and decrease shoulder blade pain.    Baseline 7/26: R & L  3/5 Low Trap, 3+/5 Mid Trap    Time 5    Period Weeks    Status New             HEP includes following  exercise:  Access Code: [email or web address] URL: [email or web address] Date: 06/28/2021 Prepared by: Bradly Chris  Exercises Seated Gentle Upper Trapezius Stretch - 1 x daily - 7 x weekly - 1 sets - 5 reps - 60 hold Doorway Rhomboid Stretch - 1 x daily - 7 x weekly - 1 sets - 5 reps - 60 hold Latissimus Dorsi Stretch at Wall - 1 x daily - 7 x weekly - 1 sets - 5 reps - 60 hold Triangle Pose - 1 x daily - 7 x weekly - 1 sets - 5 reps - 60 hold Standing Shoulder Single Arm PNF D2 Flexion with Resistance - 1 x daily - 3 x weekly - 3 sets - 10 reps Supine Scapular Protraction in Flexion with Dumbbells - 1 x daily - 3 x weekly - 3 sets - 10 reps - 2 hold Seated Shoulder Row with Anchored Resistance - 1 x daily - 3 x weekly - 3 sets - 10 reps Seated Cervical Retraction - 1 x daily - 3 x weekly - 3 sets - 10 reps - 2 hold Standing Shoulder Horizontal Abduction with Resistance - 1 x daily - 3 x weekly - 2 sets - 10 reps Standing Shoulder W at Wall - 1 x daily - 3 x weekly - 2 sets - 10 reps       Plan - 06/28/21 1257     Clinical Impression Statement Pt presents for f/u for R periscapular pain. She demonstrates decreased periscapular pain with rhomboid trigger point release and increased shoulder and periscapular strength with ability to complete standing W's. She will benefit from skilled PT to increase periscapular strength and decrease right rhomboid pain to sleep comfortably.    Personal Factors and Comorbidities Time since onset of injury/illness/exacerbation    Examination-Activity Limitations Sleep    Stability/Clinical Decision Making Stable/Uncomplicated    Rehab Potential Excellent    PT Frequency 2x / week    PT Duration Other (comment)   5 weeks   PT Treatment/Interventions Cryotherapy;Moist Heat;Manual techniques;Dry needling;Passive range of motion;Joint Manipulations;Therapeutic exercise;Therapeutic activities;Electrical Stimulation    PT Next Visit Plan Continued soft  tissue work. Omega machine for shoulder and periscapular strengthening.    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     Problem List Patient Active Problem List   Diagnosis Date Noted   Primary insomnia 06/20/2021   Familial hypercholesterolemia 05/17/2020   CKD (chronic kidney disease), stage III ([place]) 05/15/2020   Diabetic nephropathy associated with type 2 diabetes mellitus ([place]) 05/15/2020   Rash and nonspecific skin eruption 05/05/2020   Vitamin B12 deficiency 04/19/2020   Controlled type 2 diabetes mellitus with diabetic nephropathy ([place]) 04/19/2020   Asthma in adult 04/19/2020   Hyperlipidemia associated with type 2 diabetes mellitus ([place]) 04/19/2020   Drug-induced myopathy 04/19/2020   Hepatic steatosis 04/19/2020   Bradly Chris PT, DPT  06/28/2021, 1:57 PM   [facility] [address], [place], [address] Phone: [phone]   Fax:  [phone]  Name: Holly Steele MRN: [id number] Date of Birth: 1956-09-06

## 2021-06-28 NOTE — Progress Notes (Signed)
HPI:      [name] is a 65 y.o. No obstetric history on file. who LMP was No LMP recorded. Patient is postmenopausal.  Subjective:   She presents today for her annual examination.  She continues to take Fosamax for osteoporosis.  She is due for a follow-up DEXA scan. She states that her sugars are much better and under control-hemoglobin A1c 6.4. She states that she is feeling vaginal dryness and irritation and is now ready to begin using estrogen vaginal cream.    Hx: The following portions of the patient's history were reviewed and updated as appropriate:             She  has a past medical history of Asthma, Atypical mole (08/14/2020), and Osteoporosis. She does not have any pertinent problems on file. She  has a past surgical history that includes Polypectomy. Her family history includes Alcohol abuse in her father; Diabetes in her brother, father, and sister; Hypertension in her mother. She  reports that she has never smoked. She has never used smokeless tobacco. She reports current alcohol use. She reports that she does not use drugs. She has a current medication list which includes the following prescription(s): albuterol, alendronate, finacea, calcium-vitamin d, clocortolone pivalate, dexcom g6 receiver, dexcom g6 sensor, dexcom g6 transmitter, doxycycline, jardiance, magnesium oxide, mometasone, onetouch verio, ozempic (1 mg/dose), praluent, trazodone, and vitamin b-12. She is allergic to keflet [cephalexin].       Review of Systems:  Review of Systems  Constitutional: Denied constitutional symptoms, night sweats, recent illness, fatigue, fever, insomnia and weight loss.  Eyes: Denied eye symptoms, eye pain, photophobia, vision change and visual disturbance.  Ears/Nose/Throat/Neck: Denied ear, nose, throat or neck symptoms, hearing loss, nasal discharge, sinus congestion and sore throat.  Cardiovascular: Denied cardiovascular symptoms, arrhythmia, chest pain/pressure,  edema, exercise intolerance, orthopnea and palpitations.  Respiratory: Denied pulmonary symptoms, asthma, pleuritic pain, productive sputum, cough, dyspnea and wheezing.  Gastrointestinal: Denied, gastro-esophageal reflux, melena, nausea and vomiting.  Genitourinary: See HPI for additional information.  Musculoskeletal: See HPI for additional information.  She has right shoulder pain and is currently being seen by orthopedic  Dermatologic: Denied dermatology symptoms, rash and scar.  Neurologic: Denied neurology symptoms, dizziness, headache, neck pain and syncope.  Psychiatric: Denied psychiatric symptoms, anxiety and depression.  Endocrine: Denied endocrine symptoms including hot flashes and night sweats.   Meds:   Current Outpatient Medications on File Prior to Visit  Medication Sig Dispense Refill   albuterol (VENTOLIN HFA) 108 (90 Base) MCG/ACT inhaler Inhale 2 puffs into the lungs every 4 (four) hours as needed for wheezing or shortness of breath (cough). 6.7 g 2   alendronate (FOSAMAX) 70 MG tablet Take 1 tablet (70 mg total) by mouth once a week. 12 tablet 3   Azelaic Acid (FINACEA) 15 % FOAM Apply to affected areas on face BID for itch 50 g 0   calcium-vitamin D (OSCAL WITH D) 500-200 MG-UNIT tablet Take 1 tablet by mouth.     Clocortolone Pivalate (CLODERM) 0.1 % cream Apply to affected areas once a day. 30 g 0   Continuous Blood Gluc Receiver (DEXCOM G6 RECEIVER) DEVI      Continuous Blood Gluc Sensor (DEXCOM G6 SENSOR) MISC Use sensor to check blood sugar every 2 weeks. E11.65 9 each 3   Continuous Blood Gluc Transmit (DEXCOM G6 TRANSMITTER) MISC Use every 90 days to check blood sugar 1 each 3   doxycycline (MONODOX) 100 MG capsule Take 1 capsule (  100 mg total) by mouth every evening. Take with food. 30 capsule 0   JARDIANCE 25 MG TABS tablet Take 1 tablet (25 mg total) by mouth daily. 90 tablet 3   magnesium oxide (MAG-OX) 400 MG tablet Take 400 mg by mouth daily.     mometasone  (ELOCON) 0.1 % cream Apply to aa's rash BID PRN. Avoid f/g/a. 50 g 0   ONETOUCH VERIO test strip Check blood sugar up to 1-2 times daily as advised. 150 each 3   OZEMPIC, 1 MG/DOSE, 4 MG/3ML SOPN Inject 1 mg into the skin once a week. 9 mL 3   PRALUENT 75 MG/ML SOAJ INJECT 75 MG INTO THE SKIN EVERY 14 (FOURTEEN) DAYS. 2 mL 2   traZODone (DESYREL) 100 MG tablet Take 1 tablet (100 mg total) by mouth at bedtime. 90 tablet 3   vitamin B-12 (CYANOCOBALAMIN) 1000 MCG tablet Take 1,000 mcg by mouth every other day.     No current facility-administered medications on file prior to visit.       Objective:     Vitals:   06/28/21 0806  BP: 128/88  Pulse: 72    Filed Weights   06/28/21 0806  Weight: 128 lb 4.8 oz (58.2 kg)              Physical examination General NAD, Conversant  HEENT Atraumatic; Op clear with mmm.  Normo-cephalic. Pupils reactive. Anicteric sclerae  Thyroid/Neck Smooth without nodularity or enlargement. Normal ROM.  Neck Supple.  Skin No rashes, lesions or ulceration. Normal palpated skin turgor. No nodularity.  Breasts: No masses or discharge.  Symmetric.  No axillary adenopathy.  Lungs: Clear to auscultation.No rales or wheezes. Normal Respiratory effort, no retractions.  Heart: NSR.  No murmurs or rubs appreciated. No periferal edema  Abdomen: Soft.  Non-tender.  No masses.  No HSM. No hernia  Extremities: Moves all appropriately.  Normal ROM for age. No lymphadenopathy.  Neuro: Oriented to PPT.  Normal mood. Normal affect.     Pelvic:   Vulva: Normal appearance.  No lesions.  Vagina: No lesions or abnormalities noted.  Very small introitus  Support: Normal pelvic support.  Urethra No masses tenderness or scarring.  Meatus Normal size without lesions or prolapse.  Cervix: Unable to visualize -patient discomfort with speculum see above  Anus: Normal exam.  No lesions.  Perineum: Normal exam.  No lesions.        Bimanual Unable  Uterus:   Adnexae:    Cul-de-sac:      Assessment:    No obstetric history on file. Patient Active Problem List   Diagnosis Date Noted   Primary insomnia 06/20/2021   Familial hypercholesterolemia 05/17/2020   CKD (chronic kidney disease), stage III ([place]) 05/15/2020   Diabetic nephropathy associated with type 2 diabetes mellitus ([place]) 05/15/2020   Rash and nonspecific skin eruption 05/05/2020   Vitamin B12 deficiency 04/19/2020   Controlled type 2 diabetes mellitus with diabetic nephropathy ([place]) 04/19/2020   Asthma in adult 04/19/2020   Hyperlipidemia associated with type 2 diabetes mellitus ([place]) 04/19/2020   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 Well woman exam with routine gynecological exam   2. Vaginal atrophy   3. Age-related osteoporosis without current pathological fracture     Significant vaginal atrophy not allowing examination   Plan:            1.  Basic Screening Recommendations The basic screening recommendations for asymptomatic women were discussed  with the patient during her visit.  The age-appropriate recommendations were discussed with her and the rational for the tests reviewed.  When I am informed by the patient that another primary care physician has previously obtained the age-appropriate tests and they are up-to-date, only outstanding tests are ordered and referrals given as necessary.  Abnormal results of tests will be discussed with her when all of her results are completed.  Routine preventative health maintenance measures emphasized: Exercise/Diet/Weight control, Tobacco Warnings, Alcohol/Substance use risks and Stress Management Patient up-to-date on mammograms-blood work through PCP.  Up-to-date on Pap. 2.  Estrace vaginal cream twice weekly. 3.  DEXA scan Orders No orders of the defined types were placed in this encounter.   No orders of the defined types were placed in this encounter.           F/U  Return in about 1 year  (around 06/28/2022) for Annual Physical.  Finis Bud, M.D. 06/28/2021 8:52 AM

## 2021-06-29 ENCOUNTER — Ambulatory Visit (INDEPENDENT_AMBULATORY_CARE_PROVIDER_SITE_OTHER): Payer: 59

## 2021-06-29 DIAGNOSIS — Z23 Encounter for immunization: Secondary | ICD-10-CM | POA: Diagnosis not present

## 2021-07-02 ENCOUNTER — Ambulatory Visit: Payer: 59 | Admitting: Physical Therapy

## 2021-07-04 ENCOUNTER — Encounter: Payer: 59 | Admitting: Physical Therapy

## 2021-07-10 ENCOUNTER — Ambulatory Visit: Payer: 59 | Attending: Orthopedic Surgery | Admitting: Physical Therapy

## 2021-07-10 DIAGNOSIS — M25511 Pain in right shoulder: Secondary | ICD-10-CM | POA: Insufficient documentation

## 2021-07-10 DIAGNOSIS — G8929 Other chronic pain: Secondary | ICD-10-CM | POA: Diagnosis present

## 2021-07-10 NOTE — Therapy (Addendum)
[name] [address] [place], [address] Phone: [phone]   Fax:  [phone]  Physical Therapy Treatment/ Discharge Summary     Episode of Care: 05/29/21-07/10/21 Patient Details  Name: Holly Steele MRN: [id number] Date of Birth: 1956/08/07 Referring Provider (PT): Marcene Duos, MD.   Encounter Date: 07/10/2021   PT End of Session - 07/10/21 1149     Visit Number 7    Number of Visits 11    Date for PT Re-Evaluation 06/23/21    PT Start Time 9371    PT Stop Time 1230    PT Time Calculation (min) 45 min    Activity Tolerance Patient tolerated treatment well;No increased pain    Behavior During Therapy [facility] for tasks assessed/performed             Past Medical History:  Diagnosis Date   Asthma    past   Atypical mole 08/14/2020   L lat buttocks    Osteoporosis     Past Surgical History:  Procedure Laterality Date   POLYPECTOMY      There were no vitals filed for this visit.   Subjective Assessment - 07/10/21 1148     Subjective Pt reports a recent bout of    Pertinent History 65 y.o. female who presents to the office complaining of right shoulder pain.  Patient returns for 6-week follow-up following AC joint injection on 03/07/2021.  Patient reports 100% relief of her pain at the beginning after the injection.  She does have continued relief but it is a little less than it was initially.  She now complains of no pain but a "tightness" in her shoulder.  She has some soreness in her shoulder blade superiorly on the right-hand side.  She also has some superior shoulder soreness but no severe pain that keeps her up at night any longer.  She is able to sleep on the right-hand side.  Denies any radicular pain or numbness/tingling or neck pain..    Limitations Other (comment)   Sleeping   How long can you sit comfortably? N/a    How long can you stand comfortably? N/a    How long can you walk  comfortably? 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     Intact rotator cuff with mild appearing supraspinatus and  infraspinatus tendinopathy.     Degenerated and frayed superior labrum.     Small volume of fluid in the subacromial/subdeltoid bursa compatible  with bursitis.    Patient Stated Goals Reduce pain            EVAL:   Persicapular strength R/L  -Mid trap 4+/5, 4+/5  -Lower trap 4/5, 4/5    THEREX: Reviewed exercises for HEP and progressions for each exercise.     Updated HEP and educated patient on changes to exercises and addition of new exercises        PT Short Term Goals - 07/10/21 1342       PT SHORT TERM GOAL #1   Title Patient will demonstrate independence and understanding with home exercise plan.    Time 2    Period Weeks    Status Achieved    Target Date 06/12/21               PT Long Term Goals - 07/10/21 1152       PT LONG TERM  GOAL #1   Title Patient will have improved function and activity level as evidenced by an increase in FOTO score by 10 points or more.    Baseline 7/26: 53/65 9/6: 59/65    Time 5    Period Weeks    Status Partially Met      PT LONG TERM GOAL #2   Title Patient will increase mid trap and lower trap strength to 4/5 in order to increase scapular stability and decrease shoulder blade pain.    Baseline 7/26: R & L  3/5 Low Trap, 3+/5 Mid Trap 9/6: R & L Low Trap 4/5 , Mid Trap 4+/5    Time 5    Period Weeks    Status Achieved            Final Hep includes the following:  Access Code: [email or web address] URL: [email or web address] Date: 07/10/2021 Prepared by: Bradly Chris  Exercises Seated Gentle Upper Trapezius Stretch - 1 x daily - 7 x weekly - 1 sets - 5 reps - 60 hold Doorway Rhomboid Stretch - 1 x daily - 7 x weekly - 1 sets - 5 reps - 60 hold Latissimus Dorsi Stretch at Wall - 1 x daily - 7 x weekly  - 1 sets - 5 reps - 60 hold Triangle Pose - 1 x daily - 7 x weekly - 1 sets - 5 reps - 60 hold Standing Shoulder Single Arm PNF D2 Flexion with Resistance - 1 x daily - 3 x weekly - 3 sets - 10 reps Supine Scapular Protraction in Flexion with Dumbbells - 1 x daily - 3 x weekly - 3 sets - 10 reps - 2 hold Seated Shoulder Row with Anchored Resistance - 1 x daily - 3 x weekly - 3 sets - 10 reps Seated Cervical Retraction - 1 x daily - 3 x weekly - 3 sets - 10 reps - 2 hold Standing Shoulder Horizontal Abduction with Resistance - 1 x daily - 3 x weekly - 2 sets - 10 reps Standing Shoulder W at Wall - 1 x daily - 3 x weekly - 2 sets - 10 reps     Plan - 07/10/21 1343     Clinical Impression Statement Pt presents for f/u for R periscapular pain and she has nearly met all of her goals and she is ready for d/c. She reports improved ability to fall asleep and move around in her bed without experiencing increased right shoulder pain. She exhibits an increase in her periscapular strength and she has a clear understanding of HEP and how to progress exercises accordingly.    Personal Factors and Comorbidities Time since onset of injury/illness/exacerbation    Examination-Activity Limitations Sleep    Stability/Clinical Decision Making Stable/Uncomplicated    Rehab Potential Excellent    PT Frequency 2x / week    PT Duration Other (comment)   5 weeks   PT Treatment/Interventions Cryotherapy;Moist Heat;Manual techniques;Dry needling;Passive range of motion;Joint Manipulations;Therapeutic exercise;Therapeutic activities;Electrical Stimulation    PT Next Visit Plan N/a    PT Home Exercise Plan [facility]    Consulted and Agree with Plan of Care Patient             Patient will benefit from skilled therapeutic intervention in order to improve the following deficits and impairments:  Pain, Decreased strength  Visit Diagnosis: 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   Problem List Patient Active  Problem List   Diagnosis Date Noted   Primary insomnia 06/20/2021   Familial  hypercholesterolemia 05/17/2020   CKD (chronic kidney disease), stage III ([place]) 05/15/2020   Diabetic nephropathy associated with type 2 diabetes mellitus ([place]) 05/15/2020   Rash and nonspecific skin eruption 05/05/2020   Vitamin B12 deficiency 04/19/2020   Controlled type 2 diabetes mellitus with diabetic nephropathy ([place]) 04/19/2020   Asthma in adult 04/19/2020   Hyperlipidemia associated with type 2 diabetes mellitus ([place]) 04/19/2020   Drug-induced myopathy 04/19/2020   Hepatic steatosis 04/19/2020   Bradly Chris PT, DPT  07/10/2021, 1:46 PM  [facility] [address] [place], [address] Phone: [phone]   Fax:  [phone]  Name: Holly Steele MRN: [id number] Date of Birth: 09-Dec-1955

## 2021-07-12 ENCOUNTER — Encounter: Payer: 59 | Admitting: Physical Therapy

## 2021-07-16 ENCOUNTER — Telehealth: Payer: Self-pay | Admitting: Obstetrics and Gynecology

## 2021-07-16 ENCOUNTER — Encounter: Payer: 59 | Admitting: Physical Therapy

## 2021-07-16 NOTE — Telephone Encounter (Signed)
Patient states that she saw Dr Amalia Hailey in August and he was suppose to do a referral for a bone density test.  She stated that she hasn't heard anything.

## 2021-07-18 NOTE — Telephone Encounter (Signed)
Spoke with pt- after consulting CM- provided patient with [facility] phone number and directed her to call and schedule. Pt was understanding, asked her to call back if she runs into any trouble.

## 2021-07-19 ENCOUNTER — Encounter: Payer: 59 | Admitting: Physical Therapy

## 2021-07-23 ENCOUNTER — Telehealth: Payer: Self-pay | Admitting: Family Medicine

## 2021-07-23 NOTE — Telephone Encounter (Signed)
Pt states her PRALUENT 75 MG/ML SOAJ Needs a prior Auth. The pharmacist toll her the insurance will not pay until they hear from Dr Raliegh Ip. However, it is also due to be refilled as well. Pt is out of this medication.   [id number] - [place], [place] - [address]

## 2021-07-25 ENCOUNTER — Other Ambulatory Visit: Payer: Self-pay

## 2021-07-25 DIAGNOSIS — E785 Hyperlipidemia, unspecified: Secondary | ICD-10-CM

## 2021-07-25 DIAGNOSIS — E1169 Type 2 diabetes mellitus with other specified complication: Secondary | ICD-10-CM

## 2021-07-25 DIAGNOSIS — G720 Drug-induced myopathy: Secondary | ICD-10-CM

## 2021-07-25 DIAGNOSIS — E7801 Familial hypercholesterolemia: Secondary | ICD-10-CM

## 2021-07-25 MED ORDER — PRALUENT 75 MG/ML ~~LOC~~ SOAJ: 75.0000 mg | SUBCUTANEOUS | 2 refills | Status: DC

## 2021-07-25 NOTE — Telephone Encounter (Signed)
PA has been started and approved. A refill [facility] to [facility] [place] per pts request.

## 2021-08-06 ENCOUNTER — Encounter: Payer: 59 | Admitting: Physical Therapy

## 2021-08-09 ENCOUNTER — Encounter: Payer: 59 | Admitting: Physical Therapy

## 2021-08-12 ENCOUNTER — Encounter: Payer: Self-pay | Admitting: Family Medicine

## 2021-08-13 ENCOUNTER — Other Ambulatory Visit: Payer: Self-pay

## 2021-08-13 ENCOUNTER — Ambulatory Visit (INDEPENDENT_AMBULATORY_CARE_PROVIDER_SITE_OTHER): Payer: 59

## 2021-08-13 ENCOUNTER — Ambulatory Visit
Admission: RE | Admit: 2021-08-13 | Discharge: 2021-08-13 | Disposition: A | Discharge status: Home / Self Care | Payer: 59 | Source: Ambulatory Visit | Attending: Obstetrics and Gynecology | Admitting: Obstetrics and Gynecology

## 2021-08-13 ENCOUNTER — Ambulatory Visit: Payer: 59 | Admitting: Family Medicine

## 2021-08-13 VITALS — Wt 128.0 lb

## 2021-08-13 DIAGNOSIS — M810 Age-related osteoporosis without current pathological fracture: Secondary | ICD-10-CM | POA: Diagnosis present

## 2021-08-13 DIAGNOSIS — Z23 Encounter for immunization: Secondary | ICD-10-CM | POA: Diagnosis not present

## 2021-08-14 NOTE — Progress Notes (Signed)
These results continue to show osteoporosis.  It reinforces the need for continued Fosamax.

## 2021-08-16 ENCOUNTER — Encounter: Payer: Self-pay | Admitting: Dermatology

## 2021-08-16 ENCOUNTER — Ambulatory Visit: Payer: 59 | Admitting: Dermatology

## 2021-08-16 ENCOUNTER — Other Ambulatory Visit: Payer: Self-pay

## 2021-08-16 DIAGNOSIS — L853 Xerosis cutis: Secondary | ICD-10-CM | POA: Diagnosis not present

## 2021-08-16 DIAGNOSIS — Z1283 Encounter for screening for malignant neoplasm of skin: Secondary | ICD-10-CM | POA: Diagnosis not present

## 2021-08-16 DIAGNOSIS — Z86018 Personal history of other benign neoplasm: Secondary | ICD-10-CM

## 2021-08-16 DIAGNOSIS — D229 Melanocytic nevi, unspecified: Secondary | ICD-10-CM

## 2021-08-16 DIAGNOSIS — L814 Other melanin hyperpigmentation: Secondary | ICD-10-CM

## 2021-08-16 DIAGNOSIS — L578 Other skin changes due to chronic exposure to nonionizing radiation: Secondary | ICD-10-CM

## 2021-08-16 DIAGNOSIS — L821 Other seborrheic keratosis: Secondary | ICD-10-CM

## 2021-08-16 DIAGNOSIS — L2089 Other atopic dermatitis: Secondary | ICD-10-CM

## 2021-08-16 DIAGNOSIS — D1800 Hemangioma unspecified site: Secondary | ICD-10-CM

## 2021-08-16 MED ORDER — EUCRISA 2 % EX OINT: 1.0000 "application " | TOPICAL_OINTMENT | Freq: Two times a day (BID) | CUTANEOUS | 2 refills | Status: DC

## 2021-08-16 NOTE — Progress Notes (Addendum)
Follow-Up Visit   Subjective  Holly Steele is a 65 y.o. female who presents for the following: Annual Exam (History of atypical nevus - TBSE today). The patient presents for Total-Body Skin Exam (TBSE) for skin cancer screening and mole check.  The following portions of the chart were reviewed this encounter and updated as appropriate:   Tobacco  Allergies  Meds  Problems  Med Hx  Surg Hx  Fam Hx     Review of Systems:  No other skin or systemic complaints except as noted in HPI or Assessment and Plan.  Objective  Well appearing patient in no apparent distress; mood and affect are within normal limits.  A full examination was performed including scalp, head, eyes, ears, nose, lips, neck, chest, axillae, abdomen, back, buttocks, bilateral upper extremities, bilateral lower extremities, hands, feet, fingers, toes, fingernails, and toenails. All findings within normal limits unless otherwise noted below.  Mid Back Xerosis  Mid Back Excoriations  Right Buccal Cheek Stuck-on, waxy, tan-brown papule or plaque --Discussed benign etiology and prognosis.    Assessment & Plan   History of Dysplastic Nevi - No evidence of recurrence today - Recommend regular full body skin exams - Recommend daily broad spectrum sunscreen SPF 30+ to sun-exposed areas, reapply every 2 hours as needed.  - Call if any new or changing lesions are noted between office visits  Lentigines - Scattered tan macules - Due to sun exposure - Benign-appearing, observe - Recommend daily broad spectrum sunscreen SPF 30+ to sun-exposed areas, reapply every 2 hours as needed. - Call for any changes  Seborrheic Keratoses - Stuck-on, waxy, tan-brown papules and/or plaques  - Benign-appearing - Discussed benign etiology and prognosis. - Observe - Call for any changes - Discussed LN2. Advised patient fee is $60 for 1st lesion and $15 each additional.  Melanocytic Nevi - Tan-brown and/or  pink-flesh-colored symmetric macules and papules - Benign appearing on exam today - Observation - Call clinic for new or changing moles - Recommend daily use of broad spectrum spf 30+ sunscreen to sun-exposed areas.   Hemangiomas - Red papules - Discussed benign nature - Observe - Call for any changes  Actinic Damage - Chronic condition, secondary to cumulative UV/sun exposure - diffuse scaly erythematous macules with underlying dyspigmentation - Recommend daily broad spectrum sunscreen SPF 30+ to sun-exposed areas, reapply every 2 hours as needed.  - Staying in the shade or wearing long sleeves, sun glasses (UVA+UVB protection) and wide brim hats (4-inch brim around the entire circumference of the hat) are also recommended for sun protection.  - Call for new or changing lesions.  Skin cancer screening performed today.  Xerosis cutis Mid Back Continue Cerave cream  Other atopic dermatitis Mid Back Atopic dermatitis (eczema) is a chronic, relapsing, pruritic condition that can significantly affect quality of life. It is often associated with allergic rhinitis and/or asthma and can require treatment with topical medications, phototherapy, or in severe cases a biologic medication called Dupixent in children and adults.   Start Eucrisa ointment qd  Crisaborole (EUCRISA) 2 % OINT - Mid Back Apply 1 application topically 2 (two) times daily.  Seborrheic keratosis - - Discussed LN2. Advised patient fee is $60 for 1st lesion and $15 each additional.  Advised pt will treat one spot at no charge today so she can see how it does and if interested in doing more in future, we will do at fee schedule noted for cosmetic Sks. Right Buccal Cheek Destruction of lesion - Right  Buccal Cheek Complexity: simple   Destruction method: cryotherapy   Informed consent: discussed and consent obtained   Timeout:  patient name, date of birth, surgical site, and procedure verified Lesion destroyed using  liquid nitrogen: Yes   Region frozen until ice ball extended beyond lesion: Yes   Outcome: patient tolerated procedure well with no complications   Post-procedure details: wound care instructions given    Return in about 1 year (around 08/16/2022) for TBSE.  I, Ashok Cordia, CMA, am acting as scribe for Sarina Ser, MD . Documentation: I have reviewed the above documentation for accuracy and completeness, and I agree with the above.  Sarina Ser, MD

## 2021-08-16 NOTE — Patient Instructions (Addendum)
Cryotherapy Aftercare  Wash gently with soap and water everyday.   Apply Vaseline and Band-Aid daily until healed.    f you have any questions or concerns for your doctor, please call our main line at [phone] and press option 4 to reach your doctor's medical assistant. If no one answers, please leave a voicemail as directed and we will return your call as soon as possible. Messages left after 4 pm will be answered the following business day.   You may also send [place] a message via [place]. We typically respond to MyChart messages within 1-2 business days.  For prescription refills, please ask your pharmacy to contact our office. Our fax number is [phone].  If you have an urgent issue when the clinic is closed that cannot wait until the next business day, you can page your doctor at the number below.    Please note that while we do our best to be available for urgent issues outside of office hours, we are not available 24/7.   If you have an urgent issue and are unable to reach [place], you may choose to seek medical care at your doctor's office, retail clinic, urgent care center, or emergency room.  If you have a medical emergency, please immediately call 911 or go to the emergency department.  Pager Numbers  - Dr. Nehemiah Massed: [phone]  - Dr. Laurence Ferrari: [phone]  - Dr. Nicole Kindred: [phone]  In the event of inclement weather, please call our main line at [phone] for an update on the status of any delays or closures.  Dermatology Medication Tips: Please keep the boxes that topical medications come in in order to help keep track of the instructions about where and how to use these. Pharmacies typically print the medication instructions only on the boxes and not directly on the medication tubes.   If your medication is too expensive, please contact our office at [phone] option 4 or send [place] a message through [place].   We are unable to tell what your co-pay for medications  will be in advance as this is different depending on your insurance coverage. However, we may be able to find a substitute medication at lower cost or fill out paperwork to get insurance to cover a needed medication.   If a prior authorization is required to get your medication covered by your insurance company, please allow [place] 1-2 business days to complete this process.  Drug prices often vary depending on where the prescription is filled and some pharmacies may offer cheaper prices.  The website www.goodrx.com contains coupons for medications through different pharmacies. The prices here do not account for what the cost may be with help from insurance (it may be cheaper with your insurance), but the website can give you the price if you did not use any insurance.  - You can print the associated coupon and take it with your prescription to the pharmacy.  - You may also stop by our office during regular business hours and pick up a GoodRx coupon card.  - If you need your prescription sent electronically to a different pharmacy, notify our office through [facility] or by phone at [phone] option 4.

## 2021-10-03 ENCOUNTER — Other Ambulatory Visit: Payer: Self-pay | Admitting: Family Medicine

## 2021-10-03 DIAGNOSIS — E1165 Type 2 diabetes mellitus with hyperglycemia: Secondary | ICD-10-CM

## 2021-10-05 ENCOUNTER — Other Ambulatory Visit: Payer: Self-pay | Admitting: Internal Medicine

## 2021-10-05 DIAGNOSIS — E1165 Type 2 diabetes mellitus with hyperglycemia: Secondary | ICD-10-CM

## 2021-10-05 NOTE — Telephone Encounter (Signed)
Requested medication (s) are due for refill today: yes  Requested medication (s) are on the active medication list: yes  Last refill:  06/12/21  Future visit scheduled: NO, was asked to return in 6 months  Notes to clinic:  Pt seen in Aug but no upcoming appt in Feb as asked, this is a mail order pharm, please assess.      Requested Prescriptions  Pending Prescriptions Disp Refills   Continuous Blood Gluc Sensor (DEXCOM G6 SENSOR) MISC [Pharmacy Med Name: DEXCOM G6 MIS SENSOR] 9 each 3    Sig: USE SENSOR TO [place] BLOOD  SUGAR EVERY 2 WEEKS     Endocrinology: Diabetes - Testing Supplies Passed - 10/03/2021 12:18 PM      Passed - Valid encounter within last 12 months    Recent Outpatient Visits           3 months ago Annual physical exam   [place], DO   9 months ago Controlled type 2 diabetes mellitus with diabetic nephropathy, without long-term current use of insulin ([place])   [facility], Devonne Doughty, DO   1 year ago Type 2 diabetes mellitus with hyperglycemia, without long-term current use of insulin ([place])   [facility], Devonne Doughty, DO   1 year ago Type 2 diabetes mellitus with hyperglycemia, without long-term current use of insulin ([place])   [facility] [place], Devonne Doughty, DO   1 year ago [place], Lupita Raider, FNP       Future Appointments             In 9 months Amalia Hailey, Nyoka Lint, MD [facility]   In 10 months Ralene Bathe, MD [place]             Continuous Blood Gluc Transmit ([place]) [place] [Pharmacy Med Name: DEXCOM G6 MIS TRANSMIT] 1 each 3    Sig: USE TO [place]     Endocrinology: Diabetes - Testing Supplies Passed - 10/03/2021 12:18 PM      Passed - Valid encounter within last 12 months    Recent Outpatient Visits           3  months ago Annual physical exam   [place], DO   9 months ago Controlled type 2 diabetes mellitus with diabetic nephropathy, without long-term current use of insulin ([place])   [facility], Devonne Doughty, DO   1 year ago Type 2 diabetes mellitus with hyperglycemia, without long-term current use of insulin [facility])   [facility], Devonne Doughty, DO   1 year ago Type 2 diabetes mellitus with hyperglycemia, without long-term current use of insulin [facility])   [facility] [place], Devonne Doughty, DO   1 year ago [facility] [place], Lupita Raider, FNP       Future Appointments             In 9 months Amalia Hailey, Nyoka Lint, MD [id number]   In 10 months Ralene Bathe, MD [place]

## 2021-10-06 NOTE — Telephone Encounter (Signed)
Per pharmacy: Pharmacy comment: The directions are unclear on the original prescription for (Dexcom G6 Sensors) dated (10/05/2021). Please respond with intended directions or comment to Pharmacy. Each sensor is replaced every 10 days.  The directions are unclear on the original prescription for (Dexcom G6 Sensors) dated (10/05/2021). Please respond with intended directions or comment to Pharmacy. Each sensor is replaced every 10 days   Requested Prescriptions  Pending Prescriptions Disp Refills   Continuous Blood Gluc Sensor (DEXCOM G6 SENSOR) MISC [Pharmacy Med Name: DEXCOM G6 MIS SENSOR] 9 each 3    Sig: USE SENSOR TO [place] 2 WEEKS     Endocrinology: Diabetes - Testing Supplies Passed - 10/05/2021 12:02 PM      Passed - Valid encounter within last 12 months    Recent Outpatient Visits           3 months ago Annual physical exam   [facility] Olin Hauser, DO   9 months ago Controlled type 2 diabetes mellitus with diabetic nephropathy, without long-term current use of insulin ([place])   [facility], Devonne Doughty, DO   1 year ago Type 2 diabetes mellitus with hyperglycemia, without long-term current use of insulin [facility])   [facility], Devonne Doughty, DO   1 year ago Type 2 diabetes mellitus with hyperglycemia, without long-term current use of insulin [facility])   [facility] [place], Devonne Doughty, DO   1 year ago [facility] [place], Lupita Raider, FNP       Future Appointments             In 8 months Amalia Hailey, Nyoka Lint, MD [facility]   In 10 months Ralene Bathe, MD [place]

## 2021-10-15 ENCOUNTER — Other Ambulatory Visit: Payer: Self-pay | Admitting: Family Medicine

## 2021-10-15 DIAGNOSIS — G720 Drug-induced myopathy: Secondary | ICD-10-CM

## 2021-10-15 DIAGNOSIS — E7801 Familial hypercholesterolemia: Secondary | ICD-10-CM

## 2021-10-15 DIAGNOSIS — E785 Hyperlipidemia, unspecified: Secondary | ICD-10-CM

## 2021-10-15 DIAGNOSIS — E1169 Type 2 diabetes mellitus with other specified complication: Secondary | ICD-10-CM

## 2021-10-15 NOTE — Telephone Encounter (Signed)
Requested Prescriptions  Pending Prescriptions Disp Refills  . PRALUENT 75 MG/ML SOAJ [Pharmacy Med Name: PRALUENT 75 MG/ML PEN]  2    Sig: INJECT 75 MG INTO THE SKIN EVERY 14 (FOURTEEN) DAYS.     Cardiovascular: PCSK9 Inhibitors Passed - 10/15/2021  1:29 AM      Passed - Total Cholesterol in normal range and within 360 days    Cholesterol  Date Value Ref Range Status  06/13/2021 143 <200 mg/dL Final         Passed - LDL in normal range and within 360 days    LDL Cholesterol (Calc)  Date Value Ref Range Status  06/13/2021 74 mg/dL (calc) Final    Comment:    Reference range: <100 . Desirable range <100 mg/dL for primary prevention;   <70 mg/dL for patients with CHD or diabetic patients  with > or = 2 CHD risk factors. [name] LDL-C is now calculated using the Martin-Hopkins  calculation, which is a validated novel method providing  better accuracy than the Friedewald equation in the  estimation of LDL-C.  Cresenciano Genre et al. Annamaria Helling. 8299;371(69): 2061-2068  (http://education.Quest[employer]ostics.com/faq/FAQ164)          Passed - HDL in normal range and within 360 days    HDL  Date Value Ref Range Status  06/13/2021 50 > OR = 50 mg/dL Final         Passed - Triglycerides in normal range and within 360 days    Triglycerides  Date Value Ref Range Status  06/13/2021 104 <150 mg/dL Final         Passed - Valid encounter within last 12 months    Recent Outpatient Visits          3 months ago Annual physical exam   [facility] Olin Hauser, DO   9 months ago Controlled type 2 diabetes mellitus with diabetic nephropathy, without long-term current use of insulin ([place])   [facility], Devonne Doughty, DO   1 year ago Type 2 diabetes mellitus with hyperglycemia, without long-term current use of insulin [facility])   [facility], Devonne Doughty, DO   1 year ago Type 2 diabetes mellitus with hyperglycemia, without  long-term current use of insulin [facility])   [facility] [place], Devonne Doughty, DO   1 year ago [facility] [place], Lupita Raider, FNP      Future Appointments            In 8 months Amalia Hailey, Nyoka Lint, MD [facility]   In 10 months Ralene Bathe, MD [place]

## 2021-10-23 ENCOUNTER — Ambulatory Visit: Payer: 59 | Admitting: Family Medicine

## 2021-10-23 ENCOUNTER — Other Ambulatory Visit: Payer: Self-pay

## 2021-10-23 ENCOUNTER — Encounter: Payer: Self-pay | Admitting: Family Medicine

## 2021-10-23 VITALS — BP 100/57 | HR 78 | Ht 65.0 in | Wt 131.8 lb

## 2021-10-23 DIAGNOSIS — F5101 Primary insomnia: Secondary | ICD-10-CM

## 2021-10-23 MED ORDER — ZOLPIDEM TARTRATE ER 6.25 MG PO TBCR: 6.2500 mg | EXTENDED_RELEASE_TABLET | Freq: Every evening | ORAL | 1 refills | Status: DC | PRN

## 2021-10-23 NOTE — Patient Instructions (Addendum)
Thank you for coming to the office today.  For insomnia,  Remain OFF Trazodone.  START Ambien CR 6.25mg  take it on occasion at first to see if this can help reset the sleep cycle for you. If it works good and no hangover effect you can take it more regularly, find what works for you.  New medications in future we can consider include Dayvigo or Quviviq  These offices have both PSYCHIATRY doctors and IT trainer (Virtual Available) [place] [address] [place], [place] [address] Phone: [phone]  [facility] Address: [address] [place], [place] [address] [email or web address] Phone: [phone]  [facility] [place] [facility] ([place] [facility]) Address: [address], [place], [place] [address] Hours: 8:30AM-5PM Phone: [phone]  [place] [facility] [place], [place] [address] Phone: [phone]  ----------------------------------------------------------------- THERAPIST ONLY  (No Psychiatry)  [facility] [phone] [address] [place], [place] [address] Johnnette Litter P: [phone]  [facility], [address] [place], [place] [address]: [place].   Address: [place], [place], [place] [address] Hours: Open today  9AM-7PM Phone: [phone]  [facility] [address] [place] Address: [address] [place], [place], [place] [address] Phone: [phone]    Sleep Hygiene Recommendations to promote healthy sleep in all patients, especially if symptoms of insomnia are worsening. Due to the nature of sleep rhythms, if your body gets "out of rhythm", it may take some time before your sleep cycle can be "reset".  Please try to follow as many of the following tips as you can, usually there are only a few of these are the primary cause  of the problem.  ?To reset your sleep rhythm, go to bed and get up at the same time every day ?Sleep only long enough to feel rested and then get out of bed ?Do not try to force yourself to sleep. If you can't sleep, get out of bed and try again later. ?Avoid naps during the day, unless excessively tired. The more sleeping during the day, then the less sleep your body needs at night.  ?Have coffee, tea, and other foods that have caffeine only in the morning ?Exercise several days a week, but not right before bed ?If you drink alcohol, prefer to have appropriate drink with one meal, but prefer to avoid alcohol in the evening, and bedtime ?If you smoke, avoid smoking, especially in the evening  ?Avoid watching TV or looking at phones, computers, or reading devices ("e-books") that give off light at least 30 minutes before bed. This artificial light sends "awake signals" to your brain and can make it harder to fall asleep. ?Make your bedroom a comfortable place where it is easy to fall asleep: Put up shades or special blackout curtains to block light from outside. Use a white noise machine to block noise. Keep the temperature cool. ?Try your best to solve or at least address your problems before you go to bed ?Use relaxation techniques to manage stress. Ask your health care provider to suggest some techniques that may work well for you. These may include: Breathing exercises. Routines to release muscle tension. Visualizing peaceful scenes.     Please schedule a Follow-up Appointment to: Return in about 3 months (around 01/21/2022), or if symptoms worsen or fail to improve, for Keep up  plan for August Annual Physical (sooner if need for insomnia).  If you have any other questions or concerns, please feel free to call the office or send a message through [place]. You may also schedule an earlier appointment if necessary.  Additionally, you may be receiving a survey about your experience at  our office within a few days to 1 week by e-mail or mail. We value your feedback.  Nobie Putnam, DO [place]

## 2021-10-23 NOTE — Progress Notes (Signed)
Subjective:    Patient ID: Holly Steele, female    DOB: Dec 20, 1955, 65 y.o.   MRN: [id number]  Holly Steele is a 65 y.o. female presenting on 10/23/2021 for Insomnia and Dizziness   HPI  Insomnia Describes sleep as very erratic, occasionally she could stay up until 4am or sometimes 9pm, she is trying to listen to her body to help get back into sleep routine. She admits she is retired and says that she is okay sleeping later in the morning if she ends up having a bad night She has a medicline pillow that helps support her shoulder, she has been going to PT regularly and has strengthened. So that has been helpful.  Admits Dizziness spells she was experiencing this at times during the day and with activity and again in evening - she attributed it to the med side effect of Trazodone. She was taking Trazodone 100mg  nightly. She has self discontinued this back in August 2022 and has been off for past 4 months and doing better with RESOLVED dizziness.  She has tried Ambien years in the past. She does not recall the dose but took nightly and felt groggy.  She endorses good mood overall and not having anxiety  She worked with a therapist virtually to help with sleep resources.   Depression screen [facility] 2/9 10/23/2021 06/20/2021 09/19/2020  Decreased Interest 0 0 0  Down, Depressed, Hopeless 0 0 0  PHQ - 2 Score 0 0 0  Altered sleeping 2 2 -  Tired, decreased energy 0 0 -  Change in appetite 0 0 -  Feeling bad or failure about yourself  0 0 -  Trouble concentrating 0 0 -  Moving slowly or fidgety/restless 0 0 -  Suicidal thoughts 0 0 -  PHQ-9 Score 2 2 -  Difficult doing work/chores Not difficult at all Not difficult at all -   GAD 7 : Generalized Anxiety Score 10/23/2021 06/20/2021  Nervous, Anxious, on Edge 0 0  Control/stop worrying 0 0  Worry too much - different things 0 0  Trouble relaxing 0 0  Restless 0 0  Easily annoyed or irritable 0 1  Afraid - awful might happen 0 0   Total GAD 7 Score 0 1  Anxiety Difficulty Not difficult at all Not difficult at all      Social History   Tobacco Use   Smoking status: Never   Smokeless tobacco: Never  Substance Use Topics   Alcohol use: Yes    Comment: Social    Drug use: Never    Review of Systems Per HPI unless specifically indicated above     Objective:    BP (!) 100/57    Pulse 78    Ht 5\' 5"  (1.651 m)    Wt 131 lb 12.8 oz (59.8 kg)    SpO2 100%    BMI 21.93 kg/m   Wt Readings from Last 3 Encounters:  10/23/21 131 lb 12.8 oz (59.8 kg)  08/13/21 128 lb (58.1 kg)  06/28/21 128 lb 4.8 oz (58.2 kg)    Physical Exam Vitals and nursing note reviewed.  Constitutional:      General: She is not in acute distress.    Appearance: Normal appearance. She is well-developed. She is not diaphoretic.     Comments: Well-appearing, comfortable, cooperative  HENT:     Head: Normocephalic and atraumatic.  Eyes:     General:        Right eye: No discharge.  Left eye: No discharge.     Conjunctiva/sclera: Conjunctivae normal.  Cardiovascular:     Rate and Rhythm: Normal rate.  Pulmonary:     Effort: Pulmonary effort is normal.  Skin:    General: Skin is warm and dry.     Findings: No erythema or rash.  Neurological:     Mental Status: She is alert and oriented to person, place, and time.  Psychiatric:        Mood and Affect: Mood normal.        Behavior: Behavior normal.        Thought Content: Thought content normal.     Comments: Well groomed, good eye contact, normal speech and thoughts      Results for orders placed or performed in visit on 06/22/21  HM DIABETES EYE EXAM  Result Value Ref Range   HM Diabetic Eye Exam No Retinopathy No Retinopathy      Assessment & Plan:   Problem List Items Addressed This Visit     Primary insomnia - Primary   Relevant Medications   zolpidem (AMBIEN CR) 6.25 MG CR tablet    Persistent Primary Insomnia Failed Melatonin, tried Ambien IR in past,  Trazodone Remain OFF Trazodone START new trial on Ambien CR 6.25mg  PRN - can take initially to see how it works and use on variable schedule based on how you do, can do nightly vs every other, vs infrequent. Future meds include Dayvigo and Dwana Curd if needed as alternatives Consider Biofeedback options if any of these locations offer it. Future reconsider sleep study, lower risk of OSA as option  Meds ordered this encounter  Medications   zolpidem (AMBIEN CR) 6.25 MG CR tablet    Sig: Take 1 tablet (6.25 mg total) by mouth at bedtime as needed for sleep.    Dispense:  30 tablet    Refill:  1    Follow up plan: Return in about 3 months (around 01/21/2022), or if symptoms worsen or fail to improve, for Keep up plan for August Annual Physical (sooner if need for insomnia).   Nobie Putnam, DO [facility] 10/23/2021, 1:32 PM

## 2021-10-24 ENCOUNTER — Ambulatory Visit (INDEPENDENT_AMBULATORY_CARE_PROVIDER_SITE_OTHER): Payer: Self-pay | Admitting: Dermatology

## 2021-10-24 DIAGNOSIS — L821 Other seborrheic keratosis: Secondary | ICD-10-CM

## 2021-10-24 NOTE — Patient Instructions (Addendum)
If You Need Anything After Your Visit  If you have any questions or concerns for your doctor, please call our main line at [phone] and press option 4 to reach your doctor's medical assistant. If no one answers, please leave a voicemail as directed and we will return your call as soon as possible. Messages left after 4 pm will be answered the following business day.   You may also send us a message via MyChart. We typically respond to MyChart messages within 1-2 business days.  For prescription refills, please ask your pharmacy to contact our office. Our fax number is [phone].  If you have an urgent issue when the clinic is closed that cannot wait until the next business day, you can page your doctor at the number below.    Please note that while we do our best to be available for urgent issues outside of office hours, we are not available 24/7.   If you have an urgent issue and are unable to reach us, you may choose to seek medical care at your doctor's office, retail clinic, urgent care center, or emergency room.  If you have a medical emergency, please immediately call 911 or go to the emergency department.  Pager Numbers  - Dr. Kowalski: [phone]  - Dr. Moye: [phone]  - Dr. Stewart: [phone]  In the event of inclement weather, please call our main line at [phone] for an update on the status of any delays or closures.  Dermatology Medication Tips: Please keep the boxes that topical medications come in in order to help keep track of the instructions about where and how to use these. Pharmacies typically print the medication instructions only on the boxes and not directly on the medication tubes.   If your medication is too expensive, please contact our office at [phone] option 4 or send us a message through MyChart.   We are unable to tell what your co-pay for medications will be in advance as this is different depending on your insurance coverage.  However, we may be able to find a substitute medication at lower cost or fill out paperwork to get insurance to cover a needed medication.   If a prior authorization is required to get your medication covered by your insurance company, please allow us 1-2 business days to complete this process.  Drug prices often vary depending on where the prescription is filled and some pharmacies may offer cheaper prices.  The website www.goodrx.com contains coupons for medications through different pharmacies. The prices here do not account for what the cost may be with help from insurance (it may be cheaper with your insurance), but the website can give you the price if you did not use any insurance.  - You can print the associated coupon and take it with your prescription to the pharmacy.  - You may also stop by our office during regular business hours and pick up a GoodRx coupon card.  - If you need your prescription sent electronically to a different pharmacy, notify our office through [facility] MyChart or by phone at [phone] option 4.     Si Usted Necesita Algo Despus de Su Visita  Tambin puede enviarnos un mensaje a travs de MyChart. Por lo general respondemos a los mensajes de MyChart en el transcurso de 1 a 2 das hbiles.  Para renovar recetas, por favor pida a su farmacia que se ponga en contacto con nuestra oficina. Nuestro nmero de fax es el [phone].  Si tiene   un asunto urgente cuando la clnica est cerrada y que no puede esperar hasta el siguiente da hbil, puede llamar/localizar a su doctor(a) al nmero que aparece a continuacin.   Por favor, tenga en cuenta que aunque hacemos todo lo posible para estar disponibles para asuntos urgentes fuera del horario de oficina, no estamos disponibles las 24 horas del da, los 7 das de la semana.   Si tiene un problema urgente y no puede comunicarse con nosotros, puede optar por buscar atencin mdica  en el consultorio de su  doctor(a), en una clnica privada, en un centro de atencin urgente o en una sala de emergencias.  Si tiene una emergencia mdica, por favor llame inmediatamente al 911 o vaya a la sala de emergencias.  Nmeros de bper  - Dr. Kowalski: [phone]  - Dra. Moye: [phone]  - Dra. Stewart: [phone]  En caso de inclemencias del tiempo, por favor llame a nuestra lnea principal al [phone] para una actualizacin sobre el estado de cualquier retraso o cierre.  Consejos para la medicacin en dermatologa: Por favor, guarde las cajas en las que vienen los medicamentos de uso tpico para ayudarle a seguir las instrucciones sobre dnde y cmo usarlos. Las farmacias generalmente imprimen las instrucciones del medicamento slo en las cajas y no directamente en los tubos del medicamento.   Si su medicamento es muy caro, por favor, pngase en contacto con nuestra oficina llamando al [phone] y presione la opcin 4 o envenos un mensaje a travs de MyChart.   No podemos decirle cul ser su copago por los medicamentos por adelantado ya que esto es diferente dependiendo de la cobertura de su seguro. Sin embargo, es posible que podamos encontrar un medicamento sustituto a menor costo o llenar un formulario para que el seguro cubra el medicamento que se considera necesario.   Si se requiere una autorizacin previa para que su compaa de seguros cubra su medicamento, por favor permtanos de 1 a 2 das hbiles para completar este proceso.  Los precios de los medicamentos varan con frecuencia dependiendo del lugar de dnde se surte la receta y alguna farmacias pueden ofrecer precios ms baratos.  El sitio web www.goodrx.com tiene cupones para medicamentos de diferentes farmacias. Los precios aqu no tienen en cuenta lo que podra costar con la ayuda del seguro (puede ser ms barato con su seguro), pero el sitio web puede darle el precio si no utiliz ningn seguro.  - Puede imprimir el cupn  correspondiente y llevarlo con su receta a la farmacia.  - Tambin puede pasar por nuestra oficina durante el horario de atencin regular y recoger una tarjeta de cupones de GoodRx.  - Si necesita que su receta se enve electrnicamente a una farmacia diferente, informe a nuestra oficina a travs de MyChart de [place] o por telfono llamando al [phone] y presione la opcin 4.   Cryotherapy Aftercare  Wash gently with soap and water everyday.   Apply Vaseline and Band-Aid daily until healed.  

## 2021-10-24 NOTE — Progress Notes (Signed)
° °  Follow-Up Visit   Subjective  Holly Steele is a 65 y.o. female who presents for the following: SKs to treat (face no symptoms, cosmetic treatment). The patient has spots, moles and lesions to be evaluated, some may be new or changing and the patient has concerns that these could be cancer.  The following portions of the chart were reviewed this encounter and updated as appropriate:   Tobacco   Allergies   Meds   Problems   Med Hx   Surg Hx   Fam Hx      Review of Systems:  No other skin or systemic complaints except as noted in HPI or Assessment and Plan.  Objective  Well appearing patient in no apparent distress; mood and affect are within normal limits.  A focused examination was performed including face. Relevant physical exam findings are noted in the Assessment and Plan.  face x 8 (8) Stuck-on, waxy, tan-brown papules and plaques -- Discussed benign etiology and prognosis.            Assessment & Plan  Seborrheic keratosis (8) face x 8  Discussed cosmetic procedure, noncovered.  $60 for 1st lesion and $15 for each additional lesion if done on the same day.  Maximum charge $350.  One touch-up treatment included no charge. Discussed risks of treatment including dyspigmentation, small scar, and/or recurrence. Recommend daily broad spectrum sunscreen SPF 30+/photoprotection to treated areas once healed.   R cheek x 5 L cheek x 3 Total = 8  Destruction of lesion - face x 8 Complexity: simple   Destruction method: cryotherapy   Informed consent: discussed and consent obtained   Timeout:  patient name, date of birth, surgical site, and procedure verified Lesion destroyed using liquid nitrogen: Yes   Region frozen until ice ball extended beyond lesion: Yes   Outcome: patient tolerated procedure well with no complications   Post-procedure details: wound care instructions given     Return in about 2 months (around 12/25/2021) for cosmetic sk f/u.  I, Othelia Pulling,  RMA, am acting as scribe for Sarina Ser, MD  Documentation: I have reviewed the above documentation for accuracy and completeness, and I agree with the above.  Sarina Ser, MD

## 2021-11-04 ENCOUNTER — Encounter: Payer: Self-pay | Admitting: Dermatology

## 2021-11-21 ENCOUNTER — Other Ambulatory Visit: Payer: Self-pay | Admitting: Family Medicine

## 2021-11-21 DIAGNOSIS — F5101 Primary insomnia: Secondary | ICD-10-CM

## 2021-11-21 NOTE — Telephone Encounter (Signed)
Medication Refill - Medication: zolpidem (AMBIEN CR) 6.25 MG CR tablet  Has the patient contacted their pharmacy? Yes.    (Agent: If yes, when and what did the pharmacy advise?) Pgarm told pt to contact pcp  Preferred Pharmacy (with phone number or street name): [facility], [place] [facility] [facility]  Phone:  [phone] Fax:  [phone]  Has the patient been seen for an appointment in the last year OR does the patient have an upcoming appointment? Yes.    Agent: Please be advised that RX refills may take up to 3 business days. We ask that you follow-up with your pharmacy.

## 2021-11-21 NOTE — Telephone Encounter (Signed)
Requested medication (s) are due for refill today:Due 12/24/21  (Mail order)  Requested medication (s) are on the active medication list: yes    Last refill: 10/23/21  #30  1 refill  Future visit scheduled no  Notes to clinic: Not delegated. Please review.  Requested Prescriptions  Pending Prescriptions Disp Refills   zolpidem (AMBIEN CR) 6.25 MG CR tablet 30 tablet 1    Sig: Take 1 tablet (6.25 mg total) by mouth at bedtime as needed for sleep.     Not Delegated - Psychiatry:  Anxiolytics/Hypnotics Failed - 11/21/2021  5:47 PM      Failed - This refill cannot be delegated      Failed - Urine Drug Screen completed in last 360 days      Passed - Valid encounter within last 6 months    Recent Outpatient Visits           4 weeks ago Primary insomnia   [place], DO   5 months ago Annual physical exam   [facility] Olin Hauser, DO   11 months ago Controlled type 2 diabetes mellitus with diabetic nephropathy, without long-term current use of insulin ([place])   [facility], Devonne Doughty, DO   1 year ago Type 2 diabetes mellitus with hyperglycemia, without long-term current use of insulin [facility])   [facility] Olin Hauser, DO   1 year ago Type 2 diabetes mellitus with hyperglycemia, without long-term current use of insulin [facility])   [facility] Olin Hauser, DO       Future Appointments             In 1 month Nehemiah Massed Monia Sabal, MD [place]   In 7 months Amalia Hailey, Nyoka Lint, MD [facility]   In 9 months Ralene Bathe, MD [place]

## 2021-11-22 MED ORDER — ZOLPIDEM TARTRATE ER 6.25 MG PO TBCR: 6.2500 mg | EXTENDED_RELEASE_TABLET | Freq: Every evening | ORAL | 1 refills | Status: DC | PRN

## 2021-12-06 ENCOUNTER — Ambulatory Visit: Payer: 59 | Admitting: Surgical

## 2021-12-06 ENCOUNTER — Other Ambulatory Visit: Payer: Self-pay

## 2021-12-06 ENCOUNTER — Encounter: Payer: Self-pay | Admitting: Orthopedic Surgery

## 2021-12-06 DIAGNOSIS — M7711 Lateral epicondylitis, right elbow: Secondary | ICD-10-CM

## 2021-12-06 DIAGNOSIS — M7501 Adhesive capsulitis of right shoulder: Secondary | ICD-10-CM | POA: Diagnosis not present

## 2021-12-06 MED ORDER — LIDOCAINE HCL 1 % IJ SOLN
5.0000 mL | INTRAMUSCULAR | Status: AC | PRN
  Administered 2021-12-06: 5 mL

## 2021-12-06 MED ORDER — BUPIVACAINE HCL 0.5 % IJ SOLN
9.0000 mL | INTRAMUSCULAR | Status: AC | PRN
  Administered 2021-12-06: 9 mL via INTRA_ARTICULAR

## 2021-12-06 MED ORDER — METHYLPREDNISOLONE ACETATE 40 MG/ML IJ SUSP
40.0000 mg | INTRAMUSCULAR | Status: AC | PRN
  Administered 2021-12-06: 40 mg via INTRA_ARTICULAR

## 2021-12-06 NOTE — Progress Notes (Signed)
Office Visit Note   Patient: Holly Steele           Date of Birth: Apr 09, 1956           MRN: [id number] Visit Date: 12/06/2021 Requested by: Olin Hauser, DO [address] [place],  [place] [address] PCP: Olin Hauser, DO  Subjective: Chief Complaint  Patient presents with   Right Shoulder - Follow-up    HPI: Holly Steele is a 66 y.o. female who presents to the office complaining of right shoulder pain.  Patient has history of right shoulder pain with previous AC joint injection I provided good relief in mid 2022.  She returns for complaint of new shoulder pain that she localizes to the lateral shoulder with some radiation into the bicep.  She states this has been more bothersome over the last 4 to 5 months.  Mostly bothers her with long-term driving with her arm at the tendon to position, abduction in her arm, reaching up or behind her.  She denies any numbness or tingling, neck pain.  Pain does not really wake her up at night if she uses her wedge pillow but she cannot really sleep on her right arm normally.  She tried physical therapy from July to September and 2022 that has provided some relief.  She keeps up with a home exercise program.  She also does massage therapy 1-2 times per month and sees a chiropractor 1-2 times per month.  All this somewhat helps with most of the scapular pain she has complained about in the past but has not really helped a lot with the new lateral shoulder pain..                ROS: All systems reviewed are negative as they relate to the chief complaint within the history of present illness.  Patient denies fevers or chills.  Assessment & Plan: Visit Diagnoses:  1. Adhesive capsulitis of right shoulder   2. Right tennis elbow     Plan: Patient is a 66 year old female who presents for repeat evaluation of right shoulder pain.  She has new shoulder complaint with pain in the lateral shoulder that she has not really had before.  Her  chronic scapular pain is mostly controlled with her multiple modalities she has been employed over the last several months.  She does have some subtle loss of motion of the right shoulder compared to left that may represent early frozen shoulder.  With her atraumatic shoulder history, plan to administer glenohumeral injection today.  Patient tolerated the procedure well.  Follow-up with the office in 6 weeks for clinical recheck.  She also has complaint of right elbow pain today with tenderness over the lateral epicondyle and reproduction of pain with grip strength testing in resisted wrist extension.  Impression is tennis elbow of the right elbow.  Recommended some stretching exercises to keep up with and discussed how often she should do these.  She will follow-up for clinical recheck in 6 weeks for the shoulder and elbow.  Follow-Up Instructions: No follow-ups on file.   Orders:  No orders of the defined types were placed in this encounter.  No orders of the defined types were placed in this encounter.     Procedures: Large Joint Inj: R glenohumeral on 12/06/2021 11:14 AM Indications: diagnostic evaluation and pain Details: 18 G 1.5 in needle, posterior approach  Arthrogram: No  Medications: 9 mL bupivacaine 0.5 %; 40 mg methylPREDNISolone acetate 40 MG/ML; 5 mL  lidocaine 1 % Outcome: tolerated well, no immediate complications Procedure, treatment alternatives, risks and benefits explained, specific risks discussed. Consent was given by the patient. Immediately prior to procedure a time out was called to verify the correct patient, procedure, equipment, support staff and site/side marked as required. Patient was prepped and draped in the usual sterile fashion.      Clinical Data: No additional findings.  Objective: Vital Signs: There were no vitals taken for this visit.  Physical Exam:  Constitutional: Patient appears well-developed HEENT:  Head: Normocephalic Eyes:EOM are  normal Neck: Normal range of motion Cardiovascular: Normal rate Pulmonary/chest: Effort normal Neurologic: Patient is alert Skin: Skin is warm Psychiatric: Patient has normal mood and affect  Ortho Exam: Ortho exam demonstrates right shoulder with 50 degrees external rotation, 90 degrees abduction, 160 degrees forward flexion.  This compared with the left shoulder with 50 degrees external rotation, 100 degrees abduction, 180 degrees forward flexion.  Excellent rotator cuff strength of supraspinatus, infraspinatus, subscapularis.  Moderate tenderness over the [facility] joint.  More severe tenderness over the bicipital groove.  Positive O'Brien sign.  5/5 motor strength of bilateral grip strength, finger abduction, pronation/supination, bicep, tricep, deltoid.  Specialty Comments:  No specialty comments available.  Imaging: No results found.   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   Hepatic steatosis 04/19/2020   Past Medical History:  Diagnosis Date   Asthma    past   Atypical mole 08/14/2020   L lat buttocks    Osteoporosis     Family History  Problem Relation Age of Onset   Hypertension Mother    Alcohol abuse Father    Diabetes Father    Diabetes Sister    Diabetes Brother     Past Surgical History:  Procedure Laterality Date   POLYPECTOMY     Social History   Occupational History   Not on file  Tobacco Use   Smoking status: Never    Smokeless tobacco: Never  Substance and Sexual Activity   Alcohol use: Yes    Comment: Social    Drug use: Never   Sexual activity: Not Currently

## 2021-12-27 ENCOUNTER — Ambulatory Visit: Payer: 59 | Admitting: Dermatology

## 2022-01-06 ENCOUNTER — Other Ambulatory Visit: Payer: Self-pay | Admitting: Family Medicine

## 2022-01-06 DIAGNOSIS — E1169 Type 2 diabetes mellitus with other specified complication: Secondary | ICD-10-CM

## 2022-01-06 DIAGNOSIS — G720 Drug-induced myopathy: Secondary | ICD-10-CM

## 2022-01-06 DIAGNOSIS — E7801 Familial hypercholesterolemia: Secondary | ICD-10-CM

## 2022-01-07 ENCOUNTER — Ambulatory Visit: Payer: 59 | Admitting: Dermatology

## 2022-01-08 NOTE — Telephone Encounter (Signed)
Requested Prescriptions  ?Pending Prescriptions Disp Refills  ?? PRALUENT 75 MG/ML SOAJ [Pharmacy Med Name: PRALUENT 75 MG/ML PEN] 2 mL 2  ?  Sig: INJECT 75 MG INTO THE SKIN EVERY 14 (FOURTEEN) DAYS.  ?  ? Cardiovascular: PCSK9 Inhibitors Passed - 01/06/2022 11:00 AM  ?  ?  Passed - Valid encounter within last 12 months  ?  Recent Outpatient Visits   ?      ? 2 months ago Primary insomnia  ? [place], DO  ? 6 months ago Annual physical exam  ? [place], DO  ? 1 year ago Controlled type 2 diabetes mellitus with diabetic nephropathy, without long-term current use of insulin ([place])  ? [place], DO  ? 1 year ago Type 2 diabetes mellitus with hyperglycemia, without long-term current use of insulin ([place])  ? [place], DO  ? 1 year ago Type 2 diabetes mellitus with hyperglycemia, without long-term current use of insulin ([place])  ? [place], DO  ?  ?  ?Future Appointments   ?        ? In 6 days Marlou Sa, Tonna [place], MD [facility]  ? In 1 month Ralene Bathe, MD [place]  ? In 5 months Amalia Hailey, Nyoka Lint, MD [facility]  ? In 7 months Ralene Bathe, MD [place]  ?  ? ?  ?  ?  Passed - Lipid Panel completed within the last 12 months  ?  Cholesterol  ?Date Value Ref Range Status  ?06/13/2021 143 <200 mg/dL Final  ? ?LDL Cholesterol (Calc)  ?Date Value Ref Range Status  ?06/13/2021 74 mg/dL (calc) Final  ?  Comment:  ?  Reference range: <100 ?[name] ?Desirable range <100 mg/dL for primary prevention;   ?<70 mg/dL for patients with CHD or diabetic patients  ?with > or = 2 CHD risk factors. ?. ?LDL-C is now calculated using the Martin-Hopkins  ?calculation, which is a validated novel method providing  ?better accuracy than the Friedewald equation in the  ?estimation of  LDL-C.  ?Cresenciano Genre et al. Annamaria Helling. 7124;580(99): 2061-2068  ?(http://education.[employer].com/faq/FAQ164) ?  ? ?HDL  ?Date Value Ref Range Status  ?06/13/2021 50 > OR = 50 mg/dL Final  ? ?Triglycerides  ?Date Value Ref Range Status  ?06/13/2021 104 <150 mg/dL Final  ? ?  ?  ?  ? ?

## 2022-01-14 ENCOUNTER — Other Ambulatory Visit: Payer: Self-pay

## 2022-01-14 ENCOUNTER — Ambulatory Visit: Payer: 59 | Admitting: Orthopedic Surgery

## 2022-01-14 DIAGNOSIS — M7501 Adhesive capsulitis of right shoulder: Secondary | ICD-10-CM

## 2022-01-15 ENCOUNTER — Encounter: Payer: Self-pay | Admitting: Orthopedic Surgery

## 2022-01-15 MED ORDER — METHYLPREDNISOLONE ACETATE 40 MG/ML IJ SUSP
40.0000 mg | INTRAMUSCULAR | Status: AC | PRN
  Administered 2022-01-14: 40 mg via INTRA_ARTICULAR

## 2022-01-15 MED ORDER — BUPIVACAINE HCL 0.5 % IJ SOLN
9.0000 mL | INTRAMUSCULAR | Status: AC | PRN
  Administered 2022-01-14: 9 mL via INTRA_ARTICULAR

## 2022-01-15 MED ORDER — LIDOCAINE HCL 1 % IJ SOLN
5.0000 mL | INTRAMUSCULAR | Status: AC | PRN
  Administered 2022-01-14: 5 mL

## 2022-01-15 NOTE — Progress Notes (Signed)
? ?Office Visit Note ?  ?Patient: Holly Steele           ?Date of Birth: 1956/04/26           ?MRN: [id number] ?Visit Date: 01/14/2022 ?Requested by: Olin Hauser, DO ?[id number],  [place] [address] ?PCP: Olin Hauser, DO ? ?Subjective: ?Chief Complaint  ?Patient presents with  ? Other  ?  Recheck shoulder  ? ? ?HPI: Holly Steele is a patient here to follow-up her right shoulder adhesive capsulitis.  She had glenohumeral injection 6 weeks ago.  Overall she has done well with that injection.  Got about 40% relief.  Still has some occasional pain and spasms.  Still has some issues with range of motion.  Last clinic visit shoulder range of motion is 50/90/160. ?             ?ROS: All systems reviewed are negative as they relate to the chief complaint within the history of present illness.  Patient denies  fevers or chills. ? ? ?Assessment & Plan: ?Visit Diagnoses:  ?1. Adhesive capsulitis of right shoulder   ? ? ?Plan: Impression is moderate clinical improvement in right shoulder pain from glenohumeral joint injection.  Range of motion looks fairly consistent.  Overall she has very functional range of motion.  Repeat glenohumeral joint injection performed today and home exercise program discussed with the patient which really focuses on external rotation abduction and forward flexion.  It does not really look like this is an operative problem at this time.  She will follow-up with [place] as needed.  Continue anti-inflammatories as well for least 3 to 4 weeks in conjunction with the injection. ? ?Follow-Up Instructions: Return if symptoms worsen or fail to improve.  ? ?Orders:  ?No orders of the defined types were placed in this encounter. ? ?No orders of the defined types were placed in this encounter. ? ? ? ? Procedures: ?Large Joint Inj: R glenohumeral on 01/14/2022 9:12 PM ?Indications: diagnostic evaluation and pain ?Details: 18 G 1.5 in needle, posterior approach ? ?Arthrogram: No ? ?Medications:  9 mL bupivacaine 0.5 %; 40 mg methylPREDNISolone acetate 40 MG/ML; 5 mL lidocaine 1 % ?Outcome: tolerated well, no immediate complications ?Procedure, treatment alternatives, risks and benefits explained, specific risks discussed. Consent was given by the patient. Immediately prior to procedure a time out was called to verify the correct patient, procedure, equipment, support staff and site/side marked as required. Patient was prepped and draped in the usual sterile fashion.  ? ? ? ? ?Clinical Data: ?No additional findings. ? ?Objective: ?Vital Signs: There were no vitals taken for this visit. ? ?Physical Exam:  ? ?Constitutional: Patient appears well-developed ?HEENT:  ?Head: Normocephalic ?Eyes:EOM are normal ?Neck: Normal range of motion ?Cardiovascular: Normal rate ?Pulmonary/chest: Effort normal ?Neurologic: Patient is alert ?Skin: Skin is warm ?Psychiatric: Patient has normal mood and affect ? ? ?Ortho Exam: Ortho exam today demonstrates range of motion of 45/85/150.  Rotator cuff strength is intact on the right infraspinatus x-ray subscap muscle testing.  No coarse grinding or crepitus with negative O'Brien's testing.  No discrete AC joint testing on the right.  Neck range of motion is full. ? ?Specialty Comments:  ?No specialty comments available. ? ?Imaging: ?No results found. ? ? ?PMFS History: ?Patient Active Problem List  ? Diagnosis Date Noted  ? Primary insomnia 06/20/2021  ? Familial hypercholesterolemia 05/17/2020  ? CKD (chronic kidney disease), stage III ([place]) 05/15/2020  ? Diabetic nephropathy associated with  type 2 diabetes mellitus ([place]) 05/15/2020  ? Rash and nonspecific skin eruption 05/05/2020  ? Vitamin B12 deficiency 04/19/2020  ? Controlled type 2 diabetes mellitus with diabetic nephropathy ([place]) 04/19/2020  ? Asthma in adult 04/19/2020  ? Hyperlipidemia associated with type 2 diabetes mellitus ([place]) 04/19/2020  ? 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 Hepatic steatosis 04/19/2020  ? ?Past  Medical History:  ?Diagnosis Date  ? Asthma   ? past  ? Atypical mole 08/14/2020  ? L lat buttocks   ? Osteoporosis   ?  ?Family History  ?Problem Relation Age of Onset  ? Hypertension Mother   ? Alcohol abuse Father   ? Diabetes Father   ? Diabetes Sister   ? Diabetes Brother   ?  ?Past Surgical History:  ?Procedure Laterality Date  ? POLYPECTOMY    ? ?Social History  ? ?Occupational History  ? Not on file  ?Tobacco Use  ? Smoking status: Never  ? Smokeless tobacco: Never  ?Substance and Sexual Activity  ? Alcohol use: Yes  ?  Comment: Social   ? Drug use: Never  ? Sexual activity: Not Currently  ? ? ? ? ? ?

## 2022-01-21 ENCOUNTER — Encounter: Payer: Self-pay | Admitting: Dermatology

## 2022-02-07 ENCOUNTER — Telehealth: Payer: Self-pay

## 2022-02-07 NOTE — Telephone Encounter (Signed)
Copied from [place] [id number]. Topic: General - Other ?>> Feb 07, 2022  2:40 PM Leward Quan A wrote: ?Reason for CRM: Patient called in to inform Dr Raliegh Ip that she noticed on her My Chart that she is past due for her A1c check need to know if labs can be ordered since she is not yet due. Can be reached at  Ph# [phone] ? ? ?Want me to schedule for an appt or lab? ?

## 2022-02-07 NOTE — Telephone Encounter (Signed)
Last apt for diabetes was at physical 06/2022, advised her return in 6 months for A1c check. ? ?If you could contact her to schedule an office visit appointment with me anytime in the next 30 days is fine, we can "Follow-up Diabetes A1c", hopefully we will have POC fingerstick A1c machine ready. No lab prior to apt. ? ?Thanks ? ?Nobie Putnam, DO ?[facility] ?[facility] ?02/07/2022, 5:31 PM ? ?

## 2022-02-08 NOTE — Telephone Encounter (Signed)
Sent her a Mychart message to call and get scheduled for an in office visit for a check up and have her A1c checked.  ?

## 2022-02-21 ENCOUNTER — Ambulatory Visit: Payer: 59 | Admitting: Dermatology

## 2022-02-21 DIAGNOSIS — L821 Other seborrheic keratosis: Secondary | ICD-10-CM

## 2022-02-21 NOTE — Progress Notes (Signed)
? ?  Follow-Up Visit ?  ?Subjective  ?Holly Steele is a 66 y.o. female who presents for the following: Follow-up (Recheck cosmetic SK - treated with LN2 to bilateral cheeks). ?The patient has spots, moles and lesions to be evaluated, some may be new or changing and the patient has concerns that these could be cancer. ? ?The following portions of the chart were reviewed this encounter and updated as appropriate:  ? Tobacco  Allergies  Meds  Problems  Med Hx  Surg Hx  Fam Hx   ?  ?Review of Systems:  No other skin or systemic complaints except as noted in HPI or Assessment and Plan. ? ?Objective  ?Well appearing patient in no apparent distress; mood and affect are within normal limits. ? ?A focused examination was performed including face. Relevant physical exam findings are noted in the Assessment and Plan. ? ?Right cheek x 5, left cheek x 3 (8) ?Stuck-on, waxy, tan-brown papule or plaque --Discussed benign etiology and prognosis.  ? ? ?Assessment & Plan  ?Seborrheic keratosis (8) ?Right cheek x 5, left cheek x 3 ? ?Residual - no charge for treatment today ? ?Discussed the treatment option of BBL/laser.  Typically we recommend 1-3 treatment sessions about 5-8 weeks apart for best results.  The patient's condition may require "maintenance treatments" in the future.  The fee for BBL / laser treatments is $350 per treatment session for the whole face.  A fee can be quoted for other parts of the body. ?Insurance typically does not pay for BBL/laser treatments and therefore the fee is an out-of-pocket cost. ? ?Destruction of lesion - Right cheek x 5, left cheek x 3 ?Complexity: simple   ?Destruction method: cryotherapy   ?Informed consent: discussed and consent obtained   ?Timeout:  patient name, date of birth, surgical site, and procedure verified ?Lesion destroyed using liquid nitrogen: Yes   ?Region frozen until ice ball extended beyond lesion: Yes   ?Outcome: patient tolerated procedure well with no  complications   ?Post-procedure details: wound care instructions given   ? ?Return for Follow up as scheduled, TBSE. ? ?I, Ashok Cordia, CMA, am acting as scribe for Sarina Ser, MD . ?Documentation: I have reviewed the above documentation for accuracy and completeness, and I agree with the above. ? ?Sarina Ser, MD ? ?

## 2022-02-21 NOTE — Patient Instructions (Signed)

## 2022-03-03 ENCOUNTER — Encounter: Payer: Self-pay | Admitting: Dermatology

## 2022-03-06 ENCOUNTER — Other Ambulatory Visit: Payer: Self-pay | Admitting: Family Medicine

## 2022-03-06 ENCOUNTER — Ambulatory Visit: Payer: 59 | Admitting: Family Medicine

## 2022-03-06 ENCOUNTER — Encounter: Payer: Self-pay | Admitting: Family Medicine

## 2022-03-06 VITALS — BP 112/68 | HR 70 | Ht 65.0 in | Wt 125.6 lb

## 2022-03-06 DIAGNOSIS — E538 Deficiency of other specified B group vitamins: Secondary | ICD-10-CM

## 2022-03-06 DIAGNOSIS — E559 Vitamin D deficiency, unspecified: Secondary | ICD-10-CM

## 2022-03-06 DIAGNOSIS — F5101 Primary insomnia: Secondary | ICD-10-CM

## 2022-03-06 DIAGNOSIS — E7801 Familial hypercholesterolemia: Secondary | ICD-10-CM

## 2022-03-06 DIAGNOSIS — E1169 Type 2 diabetes mellitus with other specified complication: Secondary | ICD-10-CM

## 2022-03-06 DIAGNOSIS — E1121 Type 2 diabetes mellitus with diabetic nephropathy: Secondary | ICD-10-CM

## 2022-03-06 DIAGNOSIS — Z0000 Encounter for general adult medical examination without abnormal findings: Secondary | ICD-10-CM

## 2022-03-06 MED ORDER — ESCITALOPRAM OXALATE 10 MG PO TABS: 10.0000 mg | ORAL_TABLET | Freq: Every day | ORAL | 1 refills | Status: DC

## 2022-03-06 MED ORDER — ZOLPIDEM TARTRATE ER 6.25 MG PO TBCR: 6.2500 mg | EXTENDED_RELEASE_TABLET | Freq: Every evening | ORAL | 2 refills | Status: DC | PRN

## 2022-03-06 NOTE — Patient Instructions (Addendum)
Thank you for coming to the office today. ? ?Start new med, lexapro generic '10mg'$  daily, sent to mail order ? ?May take 3-4 weeks for full effect ? ?Keep on Ambien for first few weeks to help as needed. ? ?In future can adjust dose half or double ? ?Consider longer term dosing Ambien continued release if it is truly the only one that works. ? ?Labs today ? ?DUE for FASTING BLOOD WORK (no food or drink after midnight before the lab appointment, only water or coffee without cream/sugar on the morning of) ? ?SCHEDULE "Lab Only" visit in the morning at the clinic for lab draw in 4 MONTHS  ? ?- Make sure Lab Only appointment is at about 1 week before your next appointment, so that results will be available ? ?For Lab Results, once available within 2-3 days of blood draw, you can can log in to MyChart online to view your results and a brief explanation. Also, we can discuss results at next follow-up visit. ? ? ?Please schedule a Follow-up Appointment to: Return in about 4 months (around 07/07/2022) for 4 month fasting lab only then 1 week later Annual Physical. ? ?If you have any other questions or concerns, please feel free to call the office or send a message through [place]. You may also schedule an earlier appointment if necessary. ? ?Additionally, you may be receiving a survey about your experience at our office within a few days to 1 week by e-mail or mail. We value your feedback. ? ?Nobie Putnam, DO ?[facility] ?

## 2022-03-06 NOTE — Progress Notes (Signed)
? ?Subjective:  ? ? Patient ID: Holly Steele, female    DOB: 02/07/1956, 66 y.o.   MRN: [id number] ? ?Holly Steele is a 66 y.o. female presenting on 03/06/2022 for Diabetes and Insomnia ? ? ?HPI ? ?CHRONIC DM, Type 2 / CKD III ?Hyperlipidemia in T2DM ?Major improved overall ?CBGs - continuous glucose monitor, DexCom6 - refill 9 sensors (1 box 3 sensor x 3 = 90 day), 1 transmitter - previously covered by insurance ?Meds: Ozempic '1mg'$  weekly inj, Jardiance '25mg'$  daily ?Reports good compliance. Tolerating well w/o side-effects ?Not on ACEi/ARB. Urine albumin was negative 04/19/20 - due today ?Last DM [facility] [place] 05/2021 ?Lifestyle: ?- Diet Low Carb Diet, see above ?- Now going to gym more and walking ?Denies hypoglycemia, polyuria, visual changes, numbness or tingling. ?   ?Insomnia ?Anxiety ?Describes sleep as very erratic, occasionally she could stay up until 4am or sometimes 9pm, she is trying to listen to her body to help get back into sleep routine. ?She admits she is retired and says that she is okay sleeping later in the morning if she ends up having a bad night ?She has a medicline pillow that helps support her shoulder, she has been going to PT regularly and has strengthened. So that has been helpful. ?  ?Admits Dizziness spells she was experiencing this at times during the day and with activity and again in evening - she attributed it to the med side effect of Trazodone. She was taking Trazodone '100mg'$  nightly. She has self discontinued this back in August 2022 and has been off for past 4 months and doing better with RESOLVED dizziness. ?  ?Since last visit 10/2021, she was restarted on Ambien CR 6.'25mg'$  PRN and she was taking PRN not every night. It usually does work fairly well. ?  ?She endorses good mood overall and not having anxiety ?  ?She worked with a Transport planner virtually to help with sleep resources. They discussed that some anxiety can be related to sleeping. She seems to have difficulty "turning off  her mind" and she does techniques with watching TV and listening to meditation to help her relax. Also when she  ? ?Interested medication ? ? ? ?  03/06/2022  ? 10:08 AM 10/23/2021  ?  1:40 PM 06/20/2021  ?  8:25 AM  ?Depression screen PHQ 2/9  ?Decreased Interest 0 0 0  ?Down, Depressed, Hopeless 0 0 0  ?PHQ - 2 Score 0 0 0  ?Altered sleeping '2 2 2  '$ ?Tired, decreased energy 1 0 0  ?Change in appetite 0 0 0  ?Feeling bad or failure about yourself  0 0 0  ?Trouble concentrating 0 0 0  ?Moving slowly or fidgety/restless 0 0 0  ?Suicidal thoughts 0 0 0  ?PHQ-9 Score '3 2 2  '$ ?Difficult doing work/chores Not difficult at all Not difficult at all Not difficult at all  ? ? ?Social History  ? ?Tobacco Use  ? Smoking status: Never  ? Smokeless tobacco: Never  ?Vaping Use  ? Vaping Use: Never used  ?Substance Use Topics  ? Alcohol use: Yes  ?  Comment: Social   ? Drug use: Never  ? ? ?Review of Systems ?Per HPI unless specifically indicated above ? ?   ?Objective:  ?  ?BP 112/68   Pulse 70   Ht '5\' 5"'$  (1.651 m)   Wt 125 lb 9.6 oz (57 kg)   SpO2 100%   BMI 20.90 kg/m?   ?Wt Readings from Last 3  Encounters:  ?03/06/22 125 lb 9.6 oz (57 kg)  ?10/23/21 131 lb 12.8 oz (59.8 kg)  ?08/13/21 128 lb (58.1 kg)  ?  ?Physical Exam ?Vitals and nursing note reviewed.  ?Constitutional:   ?   General: She is not in acute distress. ?   Appearance: Normal appearance. She is well-developed. She is not diaphoretic.  ?   Comments: Well-appearing, comfortable, cooperative  ?HENT:  ?   Head: Normocephalic and atraumatic.  ?Eyes:  ?   General:     ?   Right eye: No discharge.     ?   Left eye: No discharge.  ?   Conjunctiva/sclera: Conjunctivae normal.  ?Cardiovascular:  ?   Rate and Rhythm: Normal rate.  ?Pulmonary:  ?   Effort: Pulmonary effort is normal.  ?Skin: ?   General: Skin is warm and dry.  ?   Findings: No erythema or rash.  ?Neurological:  ?   Mental Status: She is alert and oriented to person, place, and time.  ?Psychiatric:     ?    Mood and Affect: Mood normal.     ?   Behavior: Behavior normal.     ?   Thought Content: Thought content normal.  ?   Comments: Well groomed, good eye contact, normal speech and thoughts  ? ?Results for orders placed or performed in visit on 06/22/21  ?HM DIABETES EYE EXAM  ?Result Value Ref Range  ? HM Diabetic Eye Exam No Retinopathy No Retinopathy  ? ?   ?Assessment & Plan:  ? ?Problem List Items Addressed This Visit   ? ? Vitamin B12 deficiency  ? Relevant Orders  ? Vitamin B12  ? Primary insomnia  ? Relevant Medications  ? escitalopram (LEXAPRO) 10 MG tablet  ? Controlled type 2 diabetes mellitus with diabetic nephropathy ([place]) - Primary  ? Relevant Orders  ? Hemoglobin A1c  ? ?Other Visit Diagnoses   ? ? Vitamin D deficiency      ? Relevant Orders  ? VITAMIN D 25 Hydroxy (Vit-D Deficiency, Fractures)  ? ?  ? ?Insomnia ?Suspected component of anxiety related to her insomnia ?Otherwise limited other factors ?Sleep hygiene already ?  ?Start new med, lexapro generic '10mg'$  daily, sent to mail order. Note - May take 3-4 weeks for full effect ? ?Keep on Ambien for first few weeks to help as needed. ?Refill Ambien CR 6.25 ? ?if SSRi is ineffective we can consider longer term regular use of CR ambien ? ?Labs today Vitamin testing follow-up ? ?DM2 ?Reviewed CGM readings. ?Due for A1c today ? ?Meds ordered this encounter  ?Medications  ? escitalopram (LEXAPRO) 10 MG tablet  ?  Sig: Take 1 tablet (10 mg total) by mouth daily.  ?  Dispense:  90 tablet  ?  Refill:  1  ? ? ? ?Follow up plan: ?Return in about 4 months (around 07/07/2022) for 4 month fasting lab only then 1 week later Annual Physical. ? ?Future labs ordered for 07/2022 ? ? ?Nobie Putnam, DO ?[facility] ?[facility] ?03/06/2022, 10:00 AM ?

## 2022-03-07 LAB — VITAMIN B12: Vitamin B-12: 1418 pg/mL — ABNORMAL HIGH (ref 200–1100)

## 2022-03-07 LAB — VITAMIN D 25 HYDROXY (VIT D DEFICIENCY, FRACTURES): Vit D, 25-Hydroxy: 98 ng/mL (ref 30–100)

## 2022-03-07 LAB — HEMOGLOBIN A1C
Hgb A1c MFr Bld: 6.9 % of total Hgb — ABNORMAL HIGH (ref <5.7)
Mean Plasma Glucose: 151 mg/dL
eAG (mmol/L): 8.4 mmol/L

## 2022-03-22 ENCOUNTER — Other Ambulatory Visit: Payer: Self-pay | Admitting: Family Medicine

## 2022-03-22 DIAGNOSIS — E1169 Type 2 diabetes mellitus with other specified complication: Secondary | ICD-10-CM

## 2022-03-22 DIAGNOSIS — E7801 Familial hypercholesterolemia: Secondary | ICD-10-CM

## 2022-03-22 DIAGNOSIS — G720 Drug-induced myopathy: Secondary | ICD-10-CM

## 2022-03-22 NOTE — Telephone Encounter (Signed)
Requested Prescriptions  Pending Prescriptions Disp Refills  . [place] 75 MG/ML SOAJ [Pharmacy Med Name: PRALUENT 75 MG/ML PEN] 2 mL 2    Sig: INJECT 75 MG SUBCUTANEOUSLY EVERY 14 DAYS     Cardiovascular: PCSK9 Inhibitors Passed - 03/22/2022  1:20 PM      Passed - Valid encounter within last 12 months    Recent Outpatient Visits          2 weeks ago Controlled type 2 diabetes mellitus with diabetic nephropathy, without long-term current use of insulin ([place])   [place], DO   5 months ago Primary insomnia   [place], DO   9 months ago Annual physical exam   [place], Devonne Doughty, DO   1 year ago Controlled type 2 diabetes mellitus with diabetic nephropathy, without long-term current use of insulin ([place])   [facility], Devonne Doughty, DO   1 year ago Type 2 diabetes mellitus with hyperglycemia, without long-term current use of insulin ([place])   [place], Devonne Doughty, DO      Future Appointments            In 3 months Amalia Hailey, Nyoka Lint, MD [facility]   In 5 months Ralene Bathe, MD [place] - Lipid Panel completed within the last 12 months    Cholesterol  Date Value Ref Range Status  06/13/2021 143 <200 mg/dL Final   LDL Cholesterol (Calc)  Date Value Ref Range Status  06/13/2021 74 mg/dL (calc) Final    Comment:    Reference range: <100 . Desirable range <100 mg/dL for primary prevention;   <70 mg/dL for patients with CHD or diabetic patients  with > or = 2 CHD risk factors. [name] LDL-C is now calculated using the Martin-Hopkins  calculation, which is a validated novel method providing  better accuracy than the Friedewald equation in the  estimation of LDL-C.  Cresenciano Genre et al. Annamaria Helling. 5397;673(41): 2061-2068  (http://education.[facility].com/faq/FAQ164)     HDL  Date Value Ref Range Status  06/13/2021 50 > OR = 50 mg/dL Final   Triglycerides  Date Value Ref Range Status  06/13/2021 104 <150 mg/dL Final

## 2022-04-30 LAB — HM DIABETES EYE EXAM

## 2022-05-20 ENCOUNTER — Other Ambulatory Visit: Payer: Self-pay | Admitting: Family Medicine

## 2022-05-20 DIAGNOSIS — Z1231 Encounter for screening mammogram for malignant neoplasm of breast: Secondary | ICD-10-CM

## 2022-06-14 ENCOUNTER — Other Ambulatory Visit: Payer: Self-pay | Admitting: Family Medicine

## 2022-06-14 DIAGNOSIS — E785 Hyperlipidemia, unspecified: Secondary | ICD-10-CM

## 2022-06-14 DIAGNOSIS — E1169 Type 2 diabetes mellitus with other specified complication: Secondary | ICD-10-CM

## 2022-06-14 DIAGNOSIS — G720 Drug-induced myopathy: Secondary | ICD-10-CM

## 2022-06-14 DIAGNOSIS — E7801 Familial hypercholesterolemia: Secondary | ICD-10-CM

## 2022-06-14 NOTE — Telephone Encounter (Signed)
Requested medication (s) are due for refill today:   Yes  Requested medication (s) are on the active medication list:   Yes  Future visit scheduled:   No  Seen 3 mo. ago   Last ordered: 03/22/2022 2 ml, 2 refills  Returned because labs are due per protocol.     Requested Prescriptions  Pending Prescriptions Disp Refills   [place] 56 MG/ML SOAJ [Pharmacy Med Name: PRALUENT 75 MG/ML PEN]      Sig: INJECT 75 MG SUBCUTANEOUSLY EVERY 14 DAYS     Cardiovascular: PCSK9 Inhibitors Failed - 06/14/2022  2:26 AM      Failed - Lipid Panel completed within the last 12 months    Cholesterol  Date Value Ref Range Status  06/13/2021 143 <200 mg/dL Final   LDL Cholesterol (Calc)  Date Value Ref Range Status  06/13/2021 74 mg/dL (calc) Final    Comment:    Reference range: <100 . Desirable range <100 mg/dL for primary prevention;   <70 mg/dL for patients with CHD or diabetic patients  with > or = 2 CHD risk factors. [name] LDL-C is now calculated using the Martin-Hopkins  calculation, which is a validated novel method providing  better accuracy than the Friedewald equation in the  estimation of LDL-C.  Cresenciano Genre et al. Annamaria Helling. 7867;544(92): 2061-2068  (http://education.[facility].com/faq/FAQ164)    HDL  Date Value Ref Range Status  06/13/2021 50 > OR = 50 mg/dL Final   Triglycerides  Date Value Ref Range Status  06/13/2021 104 <150 mg/dL Final         Passed - Valid encounter within last 12 months    Recent Outpatient Visits           3 months ago Controlled type 2 diabetes mellitus with diabetic nephropathy, without long-term current use of insulin ([place])   [place], DO   7 months ago Primary insomnia   [facility] Olin Hauser, DO   11 months ago Annual physical exam   [facility] Olin Hauser, DO   1 year ago Controlled type 2 diabetes mellitus with diabetic nephropathy,  without long-term current use of insulin [facility])   [facility] Olin Hauser, DO   1 year ago Type 2 diabetes mellitus with hyperglycemia, without long-term current use of insulin [facility])   [facility] [place], Devonne Doughty, DO       Future Appointments             In 2 weeks Amalia Hailey, Nyoka Lint, MD [facility]   In 2 months Ralene Bathe, MD [place]

## 2022-06-14 NOTE — Telephone Encounter (Signed)
  Notes to clinic:  Pharm request as follows: Pharmacy comment: Alternative Requested.      Requested Prescriptions  Pending Prescriptions Disp Refills   [place] 606 MG/ML SOAJ [Pharmacy Med Name: REPATHA 301 MG/ML SURECLICK]  0    Sig: INJECT 75 MG SUBCUTANEOUSLY EVERY 14 DAYS Strength: 75 mg/mL     Cardiovascular: PCSK9 Inhibitors Failed - 06/14/2022  5:54 PM      Failed - Lipid Panel completed within the last 12 months    Cholesterol  Date Value Ref Range Status  06/13/2021 143 <200 mg/dL Final   LDL Cholesterol (Calc)  Date Value Ref Range Status  06/13/2021 74 mg/dL (calc) Final    Comment:    Reference range: <100 . Desirable range <100 mg/dL for primary prevention;   <70 mg/dL for patients with CHD or diabetic patients  with > or = 2 CHD risk factors. [name] LDL-C is now calculated using the Martin-Hopkins  calculation, which is a validated novel method providing  better accuracy than the Friedewald equation in the  estimation of LDL-C.  Cresenciano Genre et al. Annamaria Helling. 6010;932(35): 2061-2068  (http://education.[facility].com/faq/FAQ164)    HDL  Date Value Ref Range Status  06/13/2021 50 > OR = 50 mg/dL Final   Triglycerides  Date Value Ref Range Status  06/13/2021 104 <150 mg/dL Final         Passed - Valid encounter within last 12 months    Recent Outpatient Visits           3 months ago Controlled type 2 diabetes mellitus with diabetic nephropathy, without long-term current use of insulin ([place])   [place], DO   7 months ago Primary insomnia   [facility] Olin [facility]er, DO   11 months ago Annual physical exam   [facility] Olin Hauser, DO   1 year ago Controlled type 2 diabetes mellitus with diabetic nephropathy, without long-term current use of insulin [facility])   [facility] Olin Hauser, DO   1 year ago Type 2 diabetes  mellitus with hyperglycemia, without long-term current use of insulin [facility])   [facility] [place], Devonne Doughty, DO       Future Appointments             In 2 weeks Amalia Hailey, Nyoka Lint, MD [facility]   In 2 months Ralene Bathe, MD [place]

## 2022-06-17 NOTE — Telephone Encounter (Signed)
Could you call patient and clarify situation with her cholesterol injection?  It looks like patient takes Praluent injection every 2 weeks for cholesterol. NOT Repatha.  I see the Praluent was ordered on 06/14/22  I will plan to decline Repatha order because that one is not preferred by insurance.  PRALUENT 75 MG/ML SOAJ  3 ordered  Edit      Summary: INJECT 75 MG SUBCUTANEOUSLY EVERY 14 DAYS, Normal  Start: 06/14/2022 Ord/Sold: 06/14/2022 (O) Report Adh:  Taking:  Long-term:  Ph[facility]acy[id number] [place] [address] Med Dose History     Dispense as written     Patient Sig: INJECT 75 MG SUBCUTANEOUSLY EVERY 14 DAYS     Ordered on: 06/14/2022     Authorized by: KOlin Hauser    Dispense: 6 mL

## 2022-06-27 ENCOUNTER — Ambulatory Visit
Admission: RE | Admit: 2022-06-27 | Discharge: 2022-06-27 | Disposition: A | Discharge status: Home / Self Care | Payer: 59 | Source: Ambulatory Visit | Attending: Family Medicine | Admitting: Family Medicine

## 2022-06-27 DIAGNOSIS — Z1231 Encounter for screening mammogram for malignant neoplasm of breast: Secondary | ICD-10-CM | POA: Insufficient documentation

## 2022-07-02 ENCOUNTER — Ambulatory Visit (INDEPENDENT_AMBULATORY_CARE_PROVIDER_SITE_OTHER): Payer: 59 | Admitting: Obstetrics and Gynecology

## 2022-07-02 ENCOUNTER — Other Ambulatory Visit: Payer: Self-pay

## 2022-07-02 ENCOUNTER — Encounter: Payer: Self-pay | Admitting: Obstetrics and Gynecology

## 2022-07-02 ENCOUNTER — Other Ambulatory Visit: Payer: Self-pay | Admitting: Family Medicine

## 2022-07-02 VITALS — BP 108/71 | HR 72 | Ht 65.0 in | Wt 124.3 lb

## 2022-07-02 DIAGNOSIS — N952 Postmenopausal atrophic vaginitis: Secondary | ICD-10-CM

## 2022-07-02 DIAGNOSIS — Z01419 Encounter for gynecological examination (general) (routine) without abnormal findings: Secondary | ICD-10-CM | POA: Diagnosis not present

## 2022-07-02 DIAGNOSIS — M810 Age-related osteoporosis without current pathological fracture: Secondary | ICD-10-CM | POA: Diagnosis not present

## 2022-07-02 DIAGNOSIS — F5101 Primary insomnia: Secondary | ICD-10-CM

## 2022-07-02 MED ORDER — ESTRADIOL 0.1 MG/GM VA CREA: 1.0000 | TOPICAL_CREAM | Freq: Every day | VAGINAL | 3 refills | Status: DC

## 2022-07-02 NOTE — Telephone Encounter (Signed)
Requested medication (s) are due for refill today - yes  Requested medication (s) are on the active medication list -yes  Future visit scheduled -yes  Last refill: 03/06/22 #30 2RF  Notes to clinic: non delegated Rx  Requested Prescriptions  Pending Prescriptions Disp Refills   zolpidem (AMBIEN CR) 6.25 MG CR tablet [Pharmacy Med Name: ZOLPIDEM ER  TAB 6.'25MG'$ ] 30 tablet 2    Sig: TAKE 1 TABLET AT BEDTIME ASNEEDED FOR SLEEP     Not Delegated - Psychiatry:  Anxiolytics/Hypnotics Failed - 07/02/2022 12:07 AM      Failed - This refill cannot be delegated      Failed - Urine Drug Screen completed in last 360 days      Passed - Valid encounter within last 6 months    Recent Outpatient Visits           3 months ago Controlled type 2 diabetes mellitus with diabetic nephropathy, without long-term current use of insulin ([place])   [facility] [place], Devonne Doughty, DO   8 months ago Primary insomnia   [facility], Devonne Doughty, DO   1 year ago Annual physical exam   [facility] Olin Hauser, DO   1 year ago Controlled type 2 diabetes mellitus with diabetic nephropathy, without long-term current use of insulin ([place])   [facility], Devonne Doughty, DO   1 year ago Type 2 diabetes mellitus with hyperglycemia, without long-term current use of insulin ([place])   [place], DO       Future Appointments             In 1 month Ralene Bathe, MD [place]               Requested Prescriptions  Pending Prescriptions Disp Refills   zolpidem (AMBIEN CR) 6.25 MG CR tablet [Pharmacy Med Name: ZOLPIDEM ER  TAB 6.'25MG'$ ] 30 tablet 2    Sig: TAKE 1 TABLET AT BEDTIME ASNEEDED FOR SLEEP     Not Delegated - Psychiatry:  Anxiolytics/Hypnotics Failed - 07/02/2022 12:07 AM      Failed - This refill cannot be delegated      Failed - Urine Drug  Screen completed in last 360 days      Passed - Valid encounter within last 6 months    Recent Outpatient Visits           3 months ago Controlled type 2 diabetes mellitus with diabetic nephropathy, without long-term current use of insulin ([place])   [facility] [place], Devonne Doughty, DO   8 months ago Primary insomnia   [place], DO   1 year ago Annual physical exam   [facility] Olin Hauser, DO   1 year ago Controlled type 2 diabetes mellitus with diabetic nephropathy, without long-term current use of insulin [facility])   [facility] [place], Devonne Doughty, DO   1 year ago Type 2 diabetes mellitus with hyperglycemia, without long-term current use of insulin [facility])   [facility] Olin Hauser, DO       Future Appointments             In 1 month Nehemiah Massed Monia Sabal, MD [place]

## 2022-07-02 NOTE — Progress Notes (Signed)
Patients presents for annual exam today. She states doing well with with estradiol cream, reports pain is much better. Up to date on pap smear and mammogram. Patient states no other questions or concerns at this time.

## 2022-07-02 NOTE — Progress Notes (Signed)
HPI:      [name] is a 66 y.o. G1P1001 who LMP was No LMP recorded. Patient is postmenopausal.  Subjective:   She presents today for her annual examination.  She has been using the estrogen vaginal cream and has noticed a difference.  She is not as sore and not having as many issues.  She uses it 1-2 times per week.  She is up-to-date on mammography.  She states that she gets her blood work elsewhere.    Hx: The following portions of the patient's history were reviewed and updated as appropriate:             She  has a past medical history of Anxiety, Asthma, Dysplastic nevus (08/14/2020), and Osteoporosis. She does not have any pertinent problems on file. She  has a past surgical history that includes Polypectomy. Her family history includes Alcohol abuse in her father; Diabetes in her brother, father, and sister; Hypertension in her mother. She  reports that she has never smoked. She has never used smokeless tobacco. She reports current alcohol use. She reports that she does not use drugs. She has a current medication list which includes the following prescription(s): albuterol, alendronate, calcium-vitamin d, dexcom g6 receiver, dexcom g6 sensor, dexcom g6 transmitter, escitalopram, jardiance, magnesium oxide, onetouch verio, ozempic (1 mg/dose), praluent, cyanocobalamin, zolpidem, and estradiol. She is allergic to keflet [cephalexin].       Review of Systems:  Review of Systems  Constitutional: Denied constitutional symptoms, night sweats, recent illness, fatigue, fever, insomnia and weight loss.  Eyes: Denied eye symptoms, eye pain, photophobia, vision change and visual disturbance.  Ears/Nose/Throat/Neck: Denied ear, nose, throat or neck symptoms, hearing loss, nasal discharge, sinus congestion and sore throat.  Cardiovascular: Denied cardiovascular symptoms, arrhythmia, chest pain/pressure, edema, exercise intolerance, orthopnea and palpitations.  Respiratory: Denied  pulmonary symptoms, asthma, pleuritic pain, productive sputum, cough, dyspnea and wheezing.  Gastrointestinal: Denied, gastro-esophageal reflux, melena, nausea and vomiting.  Genitourinary: Denied genitourinary symptoms including symptomatic vaginal discharge, pelvic relaxation issues, and urinary complaints.  Musculoskeletal: Denied musculoskeletal symptoms, stiffness, swelling, muscle weakness and myalgia.  Dermatologic: Denied dermatology symptoms, rash and scar.  Neurologic: Denied neurology symptoms, dizziness, headache, neck pain and syncope.  Psychiatric: Denied psychiatric symptoms, anxiety and depression.  Endocrine: Denied endocrine symptoms including hot flashes and night sweats.   Meds:   Current Outpatient Medications on File Prior to Visit  Medication Sig Dispense Refill   albuterol (VENTOLIN HFA) 108 (90 Base) MCG/ACT inhaler Inhale 2 puffs into the lungs every 4 (four) hours as needed for wheezing or shortness of breath (cough). 6.7 g 2   alendronate (FOSAMAX) 70 MG tablet Take 1 tablet (70 mg total) by mouth once a week. 12 tablet 3   calcium-vitamin D (OSCAL WITH D) 500-200 MG-UNIT tablet Take 1 tablet by mouth.     Continuous Blood Gluc Receiver (DEXCOM G6 RECEIVER) DEVI      Continuous Blood Gluc Sensor (DEXCOM G6 SENSOR) MISC USE SENSOR TO CHECK BLOOD  SUGAR AS DIRECTED. Replace sensor every 10 days. 9 each 3   Continuous Blood Gluc Transmit (DEXCOM G6 TRANSMITTER) MISC USE TO CHECK BLOOD SUGAR ASDIRECTED 1 each 3   escitalopram (LEXAPRO) 10 MG tablet Take 1 tablet (10 mg total) by mouth daily. 90 tablet 1   JARDIANCE 25 MG TABS tablet Take 1 tablet (25 mg total) by mouth daily. 90 tablet 3   magnesium oxide (MAG-OX) 400 MG tablet Take 400 mg by mouth daily.  ONETOUCH VERIO test strip Check blood sugar up to 1-2 times daily as advised. 150 each 3   OZEMPIC, 1 MG/DOSE, 4 MG/3ML SOPN Inject 1 mg into the skin once a week. 9 mL 3   PRALUENT 75 MG/ML SOAJ INJECT 75 MG  SUBCUTANEOUSLY EVERY 14 DAYS 6 mL 3   vitamin B-12 (CYANOCOBALAMIN) 1000 MCG tablet Take 1,000 mcg by mouth every other day.     zolpidem (AMBIEN CR) 6.25 MG CR tablet Take 1 tablet (6.25 mg total) by mouth at bedtime as needed for sleep. 30 tablet 2   No current facility-administered medications on file prior to visit.     Objective:     Vitals:   07/02/22 0811  BP: 108/71  Pulse: 72    Filed Weights   07/02/22 0811  Weight: 124 lb 4.8 oz (56.4 kg)              Physical examination General NAD, Conversant  HEENT Atraumatic; Op clear with mmm.  Normo-cephalic. Pupils reactive. Anicteric sclerae  Thyroid/Neck Smooth without nodularity or enlargement. Normal ROM.  Neck Supple.  Skin No rashes, lesions or ulceration. Normal palpated skin turgor. No nodularity.  Breasts: No masses or discharge.  Symmetric.  No axillary adenopathy.  Lungs: Clear to auscultation.No rales or wheezes. Normal Respiratory effort, no retractions.  Heart: NSR.  No murmurs or rubs appreciated. No periferal edema  Abdomen: Soft.  Non-tender.  No masses.  No HSM. No hernia  Extremities: Moves all appropriately.  Normal ROM for age. No lymphadenopathy.  Neuro: Oriented to PPT.  Normal mood. Normal affect.     Pelvic:   Vulva: Normal appearance.  No lesions.  Vagina: Very small introitus  Support: Normal pelvic support.  Urethra No masses tenderness or scarring.  Meatus Normal size without lesions or prolapse.  Cervix: Normal appearance.  No lesions.  Anus: Normal exam.  No lesions.  Perineum: Normal exam.  No lesions.        Bimanual Exam very limited by small introitus     Assessment:    G1P1001 Patient Active Problem List   Diagnosis Date Noted   Primary insomnia 06/20/2021   Familial hypercholesterolemia 05/17/2020   CKD (chronic kidney disease), stage III ([place]) 05/15/2020   Diabetic nephropathy associated with type 2 diabetes mellitus ([place]) 05/15/2020   Rash and nonspecific skin eruption  05/05/2020   Vitamin B12 deficiency 04/19/2020   Controlled type 2 diabetes mellitus with diabetic nephropathy ([place]) 04/19/2020   Asthma in adult 04/19/2020   Hyperlipidemia associated with type 2 diabetes mellitus ([place]) 04/19/2020   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 Well woman exam with routine gynecological exam   2. Vaginal atrophy   3. Age-related osteoporosis without current pathological fracture     Normal exam-atrophy improved with estrogen cream.  Exam much easier than in previous years.   Plan:            1.  Basic Screening Recommendations The basic screening recommendations for asymptomatic women were discussed with the patient during her visit.  The age-appropriate recommendations were discussed with her and the rational for the tests reviewed.  When I am informed by the patient that another primary care physician has previously obtained the age-appropriate tests and they are up-to-date, only outstanding tests are ordered and referrals given as necessary.  Abnormal results of tests will be discussed with her when all of her results are completed.  Routine preventative health maintenance measures  emphasized: Exercise/Diet/Weight control, Tobacco Warnings, Alcohol/Substance use risks and Stress Management  Continue vaginal estradiol use  Orders No orders of the defined types were placed in this encounter.   No orders of the defined types were placed in this encounter.         F/U  No follow-ups on file.  Finis Bud, M.D. 07/02/2022 8:36 AM

## 2022-08-15 ENCOUNTER — Other Ambulatory Visit: Payer: Self-pay | Admitting: Family Medicine

## 2022-08-15 DIAGNOSIS — F5101 Primary insomnia: Secondary | ICD-10-CM

## 2022-08-16 NOTE — Telephone Encounter (Signed)
Requested Prescriptions  Pending Prescriptions Disp Refills  . escitalopram (LEXAPRO) 10 MG tablet [Pharmacy Med Name: ESCITALOPRAM TAB '10MG'$ ] 90 tablet 0    Sig: TAKE 1 TABLET DAILY     Psychiatry:  Antidepressants - SSRI Passed - 08/15/2022  2:22 PM      Passed - Valid encounter within last 6 months    Recent Outpatient Visits          5 months ago Controlled type 2 diabetes mellitus with diabetic nephropathy, without long-term current use of insulin ([employer])   [facility] Olin Hauser, DO   9 months ago Primary insomnia   [place], DO   1 year ago Annual physical exam   [facility] Olin Hauser, DO   1 year ago Controlled type 2 diabetes mellitus with diabetic nephropathy, without long-term current use of insulin [facility])   [facility] Olin Hauser, DO   1 year ago Type 2 diabetes mellitus with hyperglycemia, without long-term current use of insulin [facility])   [place], Devonne Doughty, DO      Future Appointments            In 6 days Ralene Bathe, MD [place]

## 2022-08-22 ENCOUNTER — Ambulatory Visit: Payer: 59 | Admitting: Dermatology

## 2022-09-06 ENCOUNTER — Other Ambulatory Visit: Payer: Self-pay

## 2022-09-06 ENCOUNTER — Other Ambulatory Visit: Payer: 59

## 2022-09-06 DIAGNOSIS — E1169 Type 2 diabetes mellitus with other specified complication: Secondary | ICD-10-CM

## 2022-09-06 DIAGNOSIS — E7801 Familial hypercholesterolemia: Secondary | ICD-10-CM

## 2022-09-06 DIAGNOSIS — E1121 Type 2 diabetes mellitus with diabetic nephropathy: Secondary | ICD-10-CM

## 2022-09-06 DIAGNOSIS — Z0000 Encounter for general adult medical examination without abnormal findings: Secondary | ICD-10-CM

## 2022-09-07 LAB — COMPLETE METABOLIC PANEL WITH GFR
AG Ratio: 1.7 (calc) (ref 1.0–2.5)
ALT: 12 U/L (ref 6–29)
AST: 16 U/L (ref 10–35)
Albumin: 4.5 g/dL (ref 3.6–5.1)
Alkaline phosphatase (APISO): 63 U/L (ref 37–153)
BUN: 20 mg/dL (ref 7–25)
CO2: 27 mmol/L (ref 20–32)
Calcium: 9.9 mg/dL (ref 8.6–10.4)
Chloride: 100 mmol/L (ref 98–110)
Creat: 1.01 mg/dL (ref 0.50–1.05)
Globulin: 2.7 g/dL (calc) (ref 1.9–3.7)
Glucose, Bld: 108 mg/dL — ABNORMAL HIGH (ref 65–99)
Potassium: 4.4 mmol/L (ref 3.5–5.3)
Sodium: 136 mmol/L (ref 135–146)
Total Bilirubin: 0.8 mg/dL (ref 0.2–1.2)
Total Protein: 7.2 g/dL (ref 6.1–8.1)
eGFR: 62 mL/min/{1.73_m2} (ref >=60)

## 2022-09-07 LAB — LIPID PANEL
Cholesterol: 190 mg/dL (ref <200)
HDL: 52 mg/dL (ref >=50)
LDL Cholesterol (Calc): 113 mg/dL (calc) — ABNORMAL HIGH
Non-HDL Cholesterol (Calc): 138 mg/dL (calc) — ABNORMAL HIGH (ref <130)
Total CHOL/HDL Ratio: 3.7 (calc) (ref <5.0)
Triglycerides: 139 mg/dL (ref <150)

## 2022-09-07 LAB — CBC WITH DIFFERENTIAL/PLATELET
Absolute Monocytes: 417 cells/uL (ref 200–950)
Basophils Absolute: 69 cells/uL (ref 0–200)
Basophils Relative: 1.4 %
Eosinophils Absolute: 88 cells/uL (ref 15–500)
Eosinophils Relative: 1.8 %
HCT: 41.7 % (ref 35.0–45.0)
Hemoglobin: 14.2 g/dL (ref 11.7–15.5)
Lymphs Abs: 1524 cells/uL (ref 850–3900)
MCH: 29.9 pg (ref 27.0–33.0)
MCHC: 34.1 g/dL (ref 32.0–36.0)
MCV: 87.8 fL (ref 80.0–100.0)
MPV: 11 fL (ref 7.5–12.5)
Monocytes Relative: 8.5 %
Neutro Abs: 2803 cells/uL (ref 1500–7800)
Neutrophils Relative %: 57.2 %
Platelets: 291 10*3/uL (ref 140–400)
RBC: 4.75 10*6/uL (ref 3.80–5.10)
RDW: 12.1 % (ref 11.0–15.0)
Total Lymphocyte: 31.1 %
WBC: 4.9 10*3/uL (ref 3.8–10.8)

## 2022-09-07 LAB — HEMOGLOBIN A1C
Hgb A1c MFr Bld: 6.5 % of total Hgb — ABNORMAL HIGH (ref <5.7)
Mean Plasma Glucose: 140 mg/dL
eAG (mmol/L): 7.7 mmol/L

## 2022-09-07 LAB — TSH: TSH: 2.68 mIU/L (ref 0.40–4.50)

## 2022-09-12 ENCOUNTER — Other Ambulatory Visit: Payer: Self-pay | Admitting: Family Medicine

## 2022-09-12 ENCOUNTER — Ambulatory Visit: Payer: 59 | Admitting: Family Medicine

## 2022-09-12 ENCOUNTER — Encounter: Payer: Self-pay | Admitting: Family Medicine

## 2022-09-12 VITALS — BP 98/54 | HR 79 | Ht 65.0 in | Wt 125.2 lb

## 2022-09-12 DIAGNOSIS — E785 Hyperlipidemia, unspecified: Secondary | ICD-10-CM

## 2022-09-12 DIAGNOSIS — F5101 Primary insomnia: Secondary | ICD-10-CM | POA: Diagnosis not present

## 2022-09-12 DIAGNOSIS — E559 Vitamin D deficiency, unspecified: Secondary | ICD-10-CM

## 2022-09-12 DIAGNOSIS — E538 Deficiency of other specified B group vitamins: Secondary | ICD-10-CM

## 2022-09-12 DIAGNOSIS — E1121 Type 2 diabetes mellitus with diabetic nephropathy: Secondary | ICD-10-CM

## 2022-09-12 DIAGNOSIS — Z0000 Encounter for general adult medical examination without abnormal findings: Secondary | ICD-10-CM | POA: Diagnosis not present

## 2022-09-12 DIAGNOSIS — N1831 Chronic kidney disease, stage 3a: Secondary | ICD-10-CM

## 2022-09-12 DIAGNOSIS — E7801 Familial hypercholesterolemia: Secondary | ICD-10-CM

## 2022-09-12 DIAGNOSIS — E1169 Type 2 diabetes mellitus with other specified complication: Secondary | ICD-10-CM

## 2022-09-12 MED ORDER — ZOLPIDEM TARTRATE ER 6.25 MG PO TBCR: 6.2500 mg | EXTENDED_RELEASE_TABLET | Freq: Every day | ORAL | 5 refills | Status: DC

## 2022-09-12 MED ORDER — ESCITALOPRAM OXALATE 10 MG PO TABS: 10.0000 mg | ORAL_TABLET | Freq: Every day | ORAL | 3 refills | Status: DC

## 2022-09-12 MED ORDER — REPATHA 140 MG/ML ~~LOC~~ SOSY: 140.0000 mg | PREFILLED_SYRINGE | SUBCUTANEOUS | 3 refills | Status: DC

## 2022-09-12 NOTE — Assessment & Plan Note (Signed)
A1c controlled 6.5, controlled Complications - CKD-III, other including hyperlipidemia ([facility]AMILIAL)- increases risk of future cardiovascular complications   Plan:  1. Continue current therapy - Ozempic '1mg'$  weekly inj - REDUCE Jardiance '25mg'$  daily to HALF dose for now and we will order '10mg'$  daily when ready, seems she was having too much fluid loss and dizziness  2. Encourage improved lifestyle - low carb, low sugar diet, reduce portion size, continue improving regular exercise 3. Check CBG on CGM / also OneTouch Verio for calibration and PRN check re order strips. 4. Negative microalbumin 04/2020. Not on acei/arb - next due TODAY 5. Statin intolerance - CONTINUE PCSK9 inhib - but now SWITCH to Repatha due to ins coverage, no longer covering Praluent

## 2022-09-12 NOTE — Patient Instructions (Addendum)
Renew Zolpidem 6.25 CR Continue Lexapro as you are Reduce Jardiance by half 12.'5mg'$  daily now let me know if need smaller dose New rx repatha inj every 2 weeks instead of Praluent  DUE for FASTING BLOOD WORK (no food or drink after midnight before the lab appointment, only water or coffee without cream/sugar on the morning of)  SCHEDULE "Lab Only" visit in the morning at the clinic for lab draw in 6 MONTHS   - Make sure Lab Only appointment is at about 1 week before your next appointment, so that results will be available  For Lab Results, once available within 2-3 days of blood draw, you can can log in to MyChart online to view your results and a brief explanation. Also, we can discuss results at next follow-up visit.   Please schedule a Follow-up Appointment to: Return in about 6 months (around 03/13/2023) for 6 month fasting lab only then 1 week later Follow-up HLD, DM, lab results.  If you have any other questions or concerns, please feel free to call the office or send a message through [place]. You may also schedule an earlier appointment if necessary.  Additionally, you may be receiving a survey about your experience at our office within a few days to 1 week by e-mail or mail. We value your feedback.  Nobie Putnam, DO [place]

## 2022-09-12 NOTE — Assessment & Plan Note (Signed)
Secondary to DM2 see A&P Last urine microalbumin negative. Not on ACEi ARB

## 2022-09-12 NOTE — Assessment & Plan Note (Signed)
Controlled on PCSK9 Insurance not covering Praluent now SWITCH To Repatha q 2 week dosing

## 2022-09-12 NOTE — Progress Notes (Signed)
Subjective:    Patient ID: Holly Steele, female    DOB: 1956/05/18, 67 y.o.   MRN: [id number]  Holly Steele is a 66 y.o. female presenting on 09/12/2022 for Diabetes and Dizziness   HPI  CHRONIC DM, Type 2 / CKD III Familial Hyperlipidemia in T2DM Major improved overall CBGs - continuous glucose monitor, DexCom6 - refill 9 sensors (1 box 3 sensor x 3 = 90 day), 1 transmitter - previously covered by insurance Meds: Ozempic 69m weekly inj, Jardiance 251mdaily Reports good compliance. Tolerating well w/o side-effects Last DM [place] Lifestyle: - Diet Low Carb Diet, see above - Now going to gym more and walking Denies hypoglycemia, polyuria, visual changes, numbness or tingling.  Urinary frequency Dizziness Admits some hydration component, trying to re hydrate. Believes Jardiance may be contributing  Insurance no longer covering Praluent Switch to Repatha    Insomnia Anxiety  Escitalopram 1034maily, improved significantly, she has cut in half and taken it twice a day  Since last visit 10/2021, she was restarted on Ambien CR 6.29m3mN and she was taking PRN not every night. It usually does work fairly well.  Tried cut in half for lesser dose but cannot   She endorses good mood overall and not having anxiety       09/12/2022    9:23 AM 03/06/2022   10:08 AM 10/23/2021    1:40 PM  Depression screen PHQ 2/9  Decreased Interest 0 0 0  Down, Depressed, Hopeless 0 0 0  PHQ - 2 Score 0 0 0  Altered sleeping _0 Tired, decreased energy 0 1 0  Change in appetite 0 0 0  Feeling bad or failure about yourself  0 0 0  Trouble concentrating 0 0 0  Moving slowly or fidgety/restless 0 0 0  Suicidal thoughts 0 0 0  PHQ-9 Score _1 Difficult doing work/chores Not difficult at all Not difficult at all Not difficult at all    Past Medical History:  Diagnosis Date   Anxiety    Asthma    past   Dysplastic nevus 08/14/2020   L lat buttocks. Mild atypia, close  to margin.   Osteoporosis    Past Surgical History:  Procedure Laterality Date   POLYPECTOMY     Social History   Socioeconomic History   Marital status: Married    Spouse name: Not on file   Number of children: Not on file   Years of education: Not on file   Highest education level: Not on file  Occupational History   Not on file  Tobacco Use   Smoking status: Never   Smokeless tobacco: Never  Vaping Use   Vaping Use: Never used  Substance and Sexual Activity   Alcohol use: Yes    Comment: Social    Drug use: Never   Sexual activity: Not Currently    Birth control/protection: Post-menopausal  Other Topics Concern   Not on file  Social History Narrative   Not on file   Social Determinants of Health   Financial Resource Strain: Not on file  Food Insecurity: Not on file  Transportation Needs: Not on file  Physical Activity: Not on file  Stress: Not on file  Social Connections: Not on file  Intimate Partner Violence: Not on file   Family History  Problem Relation Age of Onset   Hypertension Mother    Alcohol abuse Father    Diabetes Father    Diabetes  Sister    Diabetes Brother    Current Outpatient Medications on File Prior to Visit  Medication Sig   albuterol (VENTOLIN HFA) 108 (90 Base) MCG/ACT inhaler Inhale 2 puffs into the lungs every 4 (four) hours as needed for wheezing or shortness of breath (cough).   alendronate (FOSAMAX) 70 MG tablet Take 1 tablet (70 mg total) by mouth once a week.   calcium-vitamin D (OSCAL WITH D) 500-200 MG-UNIT tablet Take 1 tablet by mouth.   Continuous Blood Gluc Receiver (DEXCOM G6 RECEIVER) DEVI    Continuous Blood Gluc Sensor (DEXCOM G6 SENSOR) MISC USE SENSOR TO CHECK BLOOD  SUGAR AS DIRECTED. Replace sensor every 10 days.   Continuous Blood Gluc Transmit (DEXCOM G6 TRANSMITTER) MISC USE TO CHECK BLOOD SUGAR ASDIRECTED   estradiol (ESTRACE) 0.1 MG/GM vaginal cream Place 1 Applicatorful vaginally at bedtime.   magnesium  oxide (MAG-OX) 400 MG tablet Take 400 mg by mouth daily.   ONETOUCH VERIO test strip Check blood sugar up to 1-2 times daily as advised.   OZEMPIC, 1 MG/DOSE, 4 MG/3ML SOPN Inject 1 mg into the skin once a week.   vitamin B-12 (CYANOCOBALAMIN) 1000 MCG tablet Take 1,000 mcg by mouth every other day.   JARDIANCE 25 MG TABS tablet 12.5 mg. Take half tablet daily   No current facility-administered medications on file prior to visit.    Review of Systems  Constitutional:  Negative for activity change, appetite change, chills, diaphoresis, fatigue and fever.  HENT:  Negative for congestion and hearing loss.   Eyes:  Negative for visual disturbance.  Respiratory:  Negative for cough, chest tightness, shortness of breath and wheezing.   Cardiovascular:  Negative for chest pain, palpitations and leg swelling.  Gastrointestinal:  Negative for abdominal pain, constipation, diarrhea, nausea and vomiting.  Genitourinary:  Negative for dysuria, frequency and hematuria.  Musculoskeletal:  Negative for arthralgias and neck pain.  Skin:  Negative for rash.  Neurological:  Negative for dizziness, weakness, light-headedness, numbness and headaches.  Hematological:  Negative for adenopathy.  Psychiatric/Behavioral:  Negative for behavioral problems, dysphoric mood and sleep disturbance.    Per HPI unless specifically indicated above      Objective:    BP (!) 98/54   Pulse 79   Ht _0  (1.651 m)   Wt 125 lb 3.2 oz (56.8 kg)   SpO2 100%   BMI 20.83 kg/m   Wt Readings from Last 3 Encounters:  09/12/22 125 lb 3.2 oz (56.8 kg)  07/02/22 124 lb 4.8 oz (56.4 kg)  03/06/22 125 lb 9.6 oz (57 kg)    Physical Exam Vitals and nursing note reviewed.  Constitutional:      General: She is not in acute distress.    Appearance: She is well-developed. She is not diaphoretic.     Comments: Well-appearing, comfortable, cooperative  HENT:     Head: Normocephalic and atraumatic.  Eyes:     General:         Right eye: No discharge.        Left eye: No discharge.     Conjunctiva/sclera: Conjunctivae normal.     Pupils: Pupils are equal, round, and reactive to light.  Neck:     Thyroid: No thyromegaly.  Cardiovascular:     Rate and Rhythm: Normal rate and regular rhythm.     Pulses: Normal pulses.     Heart sounds: Normal heart sounds. No murmur heard. Pulmonary:     Effort: Pulmonary effort is normal. No  respiratory distress.     Breath sounds: Normal breath sounds. No wheezing or rales.  Abdominal:     General: Bowel sounds are normal. There is no distension.     Palpations: Abdomen is soft. There is no mass.     Tenderness: There is no abdominal tenderness.  Musculoskeletal:        General: No tenderness. Normal range of motion.     Cervical back: Normal range of motion and neck supple.     Comments: Upper / Lower Extremities: - Normal muscle tone, strength bilateral upper extremities 5/5, lower extremities 5/5  Lymphadenopathy:     Cervical: No cervical adenopathy.  Skin:    General: Skin is warm and dry.     Findings: No erythema or rash.  Neurological:     Mental Status: She is alert and oriented to person, place, and time.     Comments: Distal sensation intact to light touch all extremities  Psychiatric:        Mood and Affect: Mood normal.        Behavior: Behavior normal.        Thought Content: Thought content normal.     Comments: Well groomed, good eye contact, normal speech and thoughts     Diabetic Foot Exam - Simple   Simple Foot Form Diabetic Foot exam was performed with the following findings: Yes 09/12/2022  9:46 AM  Visual Inspection No deformities, no ulcerations, no other skin breakdown bilaterally: Yes Sensation Testing Intact to touch and monofilament testing bilaterally: Yes Pulse Check Posterior Tibialis and Dorsalis pulse intact bilaterally: Yes Comments      Results for orders placed or performed in visit on 09/06/22  TSH  Result Value Ref  Range   TSH 2.68 0.40 - 4.50 mIU/L  Hemoglobin A1c  Result Value Ref Range   Hgb A1c MFr Bld 6.5 (H) <5.7 % of total Hgb   Mean Plasma Glucose 140 mg/dL   eAG (mmol/L) 7.7 mmol/L  CBC with Differential/Platelet  Result Value Ref Range   WBC 4.9 3.8 - 10.8 Thousand/uL   RBC 4.75 3.80 - 5.10 Million/uL   Hemoglobin 14.2 11.7 - 15.5 g/dL   HCT 41.7 35.0 - 45.0 %   MCV 87.8 80.0 - 100.0 fL   MCH 29.9 27.0 - 33.0 pg   MCHC 34.1 32.0 - 36.0 g/dL   RDW 12.1 11.0 - 15.0 %   Platelets 291 140 - 400 Thousand/uL   MPV 11.0 7.5 - 12.5 fL   Neutro Abs 2,803 1,500 - 7,800 cells/uL   Lymphs Abs 1,524 850 - 3,900 cells/uL   Absolute Monocytes 417 200 - 950 cells/uL   Eosinophils Absolute 88 15 - 500 cells/uL   Basophils Absolute 69 0 - 200 cells/uL   Neutrophils Relative % 57.2 %   Total Lymphocyte 31.1 %   Monocytes Relative 8.5 %   Eosinophils Relative 1.8 %   Basophils Relative 1.4 %  Lipid panel  Result Value Ref Range   Cholesterol 190 <200 mg/dL   HDL 52 > OR = 50 mg/dL   Triglycerides 139 <150 mg/dL   LDL Cholesterol (Calc) 113 (H) mg/dL (calc)   Total CHOL/HDL Ratio 3.7 <5.0 (calc)   Non-HDL Cholesterol (Calc) 138 (H) <130 mg/dL (calc)  COMPLETE METABOLIC PANEL WITH GFR  Result Value Ref Range   Glucose, Bld 108 (H) 65 - 99 mg/dL   BUN 20 7 - 25 mg/dL   Creat 1.01 0.50 - 1.05 mg/dL  eGFR 62 > OR = 60 mL/min/1.50m   BUN/Creatinine Ratio SEE NOTE: 6 - 22 (calc)   Sodium 136 135 - 146 mmol/L   Potassium 4.4 3.5 - 5.3 mmol/L   Chloride 100 98 - 110 mmol/L   CO2 27 20 - 32 mmol/L   Calcium 9.9 8.6 - 10.4 mg/dL   Total Protein 7.2 6.1 - 8.1 g/dL   Albumin 4.5 3.6 - 5.1 g/dL   Globulin 2.7 1.9 - 3.7 g/dL (calc)   AG Ratio 1.7 1.0 - 2.5 (calc)   Total Bilirubin 0.8 0.2 - 1.2 mg/dL   Alkaline phosphatase (APISO) 63 37 - 153 U/L   AST 16 10 - 35 U/L   ALT 12 6 - 29 U/L      Assessment & Plan:   Problem List Items Addressed This Visit     CKD (chronic [place] disease),  stage III [place]    Secondary to DM2 see A&P Last urine microalbumin negative. Not on ACEi ARB      Relevant Medications   JARDIANCE 25 MG TABS tablet   Controlled type 2 diabetes mellitus with diabetic nephropathy ([facility])    A1c controlled 6.5, controlled Complications - CKD-III, other including hyperlipidemia ([facility])- increases risk of future cardiovascular complications   Plan:  1. Continue current therapy - Ozempic 113mweekly inj - REDUCE Jardiance 2569maily to HALF dose for now and we will order 58m95mily when ready, seems she was having too much fluid loss and dizziness  2. Encourage improved lifestyle - low carb, low sugar diet, reduce portion size, continue improving regular exercise 3. Check CBG on CGM / also OneTouch Verio for calibration and PRN check re order strips. 4. Negative microalbumin 04/2020. Not on acei/arb - next due TODAY 5. Statin intolerance - CONTINUE PCSK9 inhib - but now SWITCH to Repatha due to ins coverage, no longer covering Praluent      Relevant Medications   JARDIANCE 25 MG TABS tablet   Familial hypercholesterolemia    Controlled on PCSK9 Insurance not covering Praluent now SWITCH To Repatha q 2 week dosing      Relevant Medications   REPATHA 140 MG/ML SOSY   Hyperlipidemia associated with type 2 diabetes mellitus (HCC)   Relevant Medications   REPATHA 140 MG/ML SOSY   JARDIANCE 25 MG TABS tablet   Primary insomnia   Relevant Medications   zolpidem (AMBIEN CR) 6.25 MG CR tablet   escitalopram (LEXAPRO) 10 MG tablet   Other Visit Diagnoses     Annual physical exam    -  Primary       Updated Health Maintenance information Reviewed recent lab results with patient Encouraged improvement to lifestyle with diet and exercise Goal of weight loss   Renew Zolpidem 6.25 CR Continue Lexapro as you are Reduce Jardiance by half 12.5mg 30mly now let me know if need smaller dose New rx repatha inj every 2 weeks instead of Praluent  Meds  ordered this encounter  Medications   REPATHA 140 MG/ML SOSY    Sig: Inject 140 mg into the skin every 14 (fourteen) days.    Dispense:  6 mL    Refill:  3   zolpidem (AMBIEN CR) 6.25 MG CR tablet    Sig: Take 1 tablet (6.25 mg total) by mouth at bedtime.    Dispense:  30 tablet    Refill:  5   escitalopram (LEXAPRO) 10 MG tablet    Sig: Take 1 tablet (10 mg total) by  mouth daily.    Dispense:  90 tablet    Refill:  3      Follow up plan: Return in about 6 months (around 03/13/2023) for 6 month fasting lab only then 1 week later Follow-up HLD, DM, lab results.  6 month CMET A1c Lipid Vitamin D and B12   Nobie Putnam, DO [facility] 09/12/2022, 9:37 AM

## 2022-09-18 ENCOUNTER — Encounter: Payer: Self-pay | Admitting: Family Medicine

## 2022-09-18 DIAGNOSIS — E7801 Familial hypercholesterolemia: Secondary | ICD-10-CM

## 2022-09-19 MED ORDER — REPATHA 140 MG/ML ~~LOC~~ SOSY: 140.0000 mg | PREFILLED_SYRINGE | SUBCUTANEOUS | 3 refills | Status: DC

## 2022-10-04 ENCOUNTER — Other Ambulatory Visit: Payer: Self-pay | Admitting: Family Medicine

## 2022-10-04 DIAGNOSIS — E7801 Familial hypercholesterolemia: Secondary | ICD-10-CM

## 2022-10-04 NOTE — Telephone Encounter (Signed)
Requested medication (s) are due for refill today: alternative mediatation   Requested medication (s) are on the active medication list: yes  Last refill:  09/19/22  Future visit scheduled: yes  Notes to clinic: : Alternative Requested:PLEASE SEND TO [facility].  INSURANCE PREFERS TO BE FILLED AT [facility].       Requested Prescriptions  Pending Prescriptions Disp Refills   REPATHA 140 MG/ML SOSY [Pharmacy Med Name: REPATHA 140 MG/ML SYRINGE]  3    Sig: Inject 140 mg into the skin every 14 (fourteen) days.     Cardiovascular: PCSK9 Inhibitors Passed - 10/04/2022  1:16 PM      Passed - Valid encounter within last 12 months    Recent Outpatient Visits           3 weeks ago Annual physical exam   [place], DO   7 months ago Controlled type 2 diabetes mellitus with diabetic nephropathy, without long-term current use of insulin ([place])   [facility] Olin Hauser, DO   11 months ago Primary insomnia   [place], DO   1 year ago Annual physical exam   [facility] Olin Hauser, DO   1 year ago Controlled type 2 diabetes mellitus with diabetic nephropathy, without long-term current use of insulin [facility])   [place], DO       Future Appointments             In 1 month Ralene Bathe, MD [place] - Lipid Panel completed within the last 12 months    Cholesterol  Date Value Ref Range Status  09/06/2022 190 <200 mg/dL Final   LDL Cholesterol (Calc)  Date Value Ref Range Status  09/06/2022 113 (H) mg/dL (calc) Final    Comment:    Reference range: <100 . Desirable range <100 mg/dL for primary prevention;   <70 mg/dL for patients with CHD or diabetic patients  with > or = 2 CHD risk factors. [name] LDL-C is now calculated using the Martin-Hopkins   calculation, which is a validated novel method providing  better accuracy than the Friedewald equation in the  estimation of LDL-C.  Cresenciano Genre et al. Annamaria Helling. 9480;165(53): 2061-2068  (http://education.Quest[facility].com/faq/FAQ164)    HDL  Date Value Ref Range Status  09/06/2022 52 > OR = 50 mg/dL Final   Triglycerides  Date Value Ref Range Status  09/06/2022 139 <150 mg/dL Final

## 2022-10-07 ENCOUNTER — Other Ambulatory Visit: Payer: Self-pay | Admitting: Family Medicine

## 2022-10-07 DIAGNOSIS — E7801 Familial hypercholesterolemia: Secondary | ICD-10-CM

## 2022-10-07 MED ORDER — REPATHA 140 MG/ML ~~LOC~~ SOSY: 140.0000 mg | PREFILLED_SYRINGE | SUBCUTANEOUS | 3 refills | Status: DC

## 2022-10-08 ENCOUNTER — Encounter: Payer: Self-pay | Admitting: Family Medicine

## 2022-10-14 ENCOUNTER — Other Ambulatory Visit: Payer: Self-pay | Admitting: Family Medicine

## 2022-10-14 DIAGNOSIS — M8580 Other specified disorders of bone density and structure, unspecified site: Secondary | ICD-10-CM

## 2022-10-14 DIAGNOSIS — E1121 Type 2 diabetes mellitus with diabetic nephropathy: Secondary | ICD-10-CM

## 2022-10-14 NOTE — Telephone Encounter (Signed)
Medication Refill - Medication: alendronate (FOSAMAX) 70 MG tablet , OZEMPIC, 1 MG/DOSE, 4 MG/3ML SOPN   Has the patient contacted their pharmacy? No.  Preferred Pharmacy (with phone number or street name):  [facility] [place] [place] [facility] Phone: [phone]  Fax: [phone]     Has the patient been seen for an appointment in the last year OR does the patient have an upcoming appointment? Yes.    Please assist patient appointment

## 2022-10-15 MED ORDER — ALENDRONATE SODIUM 70 MG PO TABS: 70.0000 mg | ORAL_TABLET | ORAL | 3 refills | Status: DC

## 2022-10-15 MED ORDER — OZEMPIC (1 MG/DOSE) 4 MG/3ML ~~LOC~~ SOPN: 1.0000 mg | PEN_INJECTOR | SUBCUTANEOUS | 3 refills | Status: DC

## 2022-10-15 NOTE — Telephone Encounter (Signed)
Requested Prescriptions  Pending Prescriptions Disp Refills   alendronate (FOSAMAX) 70 MG tablet 12 tablet 3    Sig: Take 1 tablet (70 mg total) by mouth once a week.     Endocrinology:  Bisphosphonates Failed - 10/14/2022  3:22 PM      Failed - Mg Level in normal range and within 360 days    No results found for: "MG"       Failed - Phosphate in normal range and within 360 days    No results found for: "PHOS"       Passed - Ca in normal range and within 360 days    Calcium  Date Value Ref Range Status  09/06/2022 9.9 8.6 - 10.4 mg/dL Final         Passed - Vitamin D in normal range and within 360 days    Vit D, 25-Hydroxy  Date Value Ref Range Status  03/06/2022 98 30 - 100 ng/mL Final    Comment:    Vitamin D Status         25-OH Vitamin D: . Deficiency:                    <20 ng/mL Insufficiency:             20 - 29 ng/mL Optimal:                 > or = 30 ng/mL . For 25-OH Vitamin D testing on patients on  D2-supplementation and patients for whom quantitation  of D2 and D3 fractions is required, the QuestAssureD(TM) 25-OH VIT D, (D2,D3), LC/MS/MS is recommended: order  code [id number] (patients >104yr). See Note 1 . Note 1 . For additional information, please refer to  http://education.[facility].com/faq/FAQ199  (This link is being provided for informational/ educational purposes only.)          Passed - Cr in normal range and within 360 days    Creat  Date Value Ref Range Status  09/06/2022 1.01 0.50 - 1.05 mg/dL Final         Passed - eGFR is 30 or above and within 360 days    GFR, Est African American  Date Value Ref Range Status  09/12/2020 58 (L) > OR = 60 mL/min/1.77mFinal   GFR, Est Non African American  Date Value Ref Range Status  09/12/2020 50 (L) > OR = 60 mL/min/1.7312minal   eGFR  Date Value Ref Range Status  09/06/2022 62 > OR = 60 mL/min/1.39m36mnal         Passed - Valid encounter within last 12 months    Recent Outpatient  Visits           1 month ago Annual physical exam   [place]   7 months ago Controlled type 2 diabetes mellitus with diabetic nephropathy, without long-term current use of insulin [facility]   11 months ago Primary insomnia   [place]   1 year ago Annual physical exam   [place]   1 year ago Controlled type 2 diabetes mellitus with diabetic nephropathy, without long-term current use of insulin ([facility]       Future Appointments             In 1  month Ralene Bathe, MD [place] Density or Dexa Scan completed in the last 2 years       OZEMPIC, 1 MG/DOSE, 4 MG/3ML SOPN 9 mL 3    Sig: Inject 1 mg into the skin once a week.     Endocrinology:  Diabetes - GLP-1 Receptor Agonists - semaglutide Failed - 10/14/2022  3:22 PM      Failed - HBA1C in normal range and within 180 days    Hemoglobin A1C  Date Value Ref Range Status  10/26/2019 8.7  Final   Hgb A1c MFr Bld  Date Value Ref Range Status  09/06/2022 6.5 (H) <5.7 % of total Hgb Final    Comment:    For someone without known diabetes, a hemoglobin A1c value of 6.5% or greater indicates that they may have  diabetes and this should be confirmed with a follow-up  test. . For someone with known diabetes, a value <7% indicates  that their diabetes is well controlled and a value  greater than or equal to 7% indicates suboptimal  control. A1c targets should be individualized based on  duration of diabetes, age, comorbid conditions, and  other considerations. . Currently, no consensus exists regarding use of hemoglobin A1c for diagnosis of diabetes for children. .          Passed - Cr in normal range and within 360 days     Creat  Date Value Ref Range Status  09/06/2022 1.01 0.50 - 1.05 mg/dL Final         Passed - Valid encounter within last 6 months    Recent Outpatient Visits           1 month ago Annual physical exam   [facility] Olin Hauser, DO   7 months ago Controlled type 2 diabetes mellitus with diabetic nephropathy, without long-term current use of insulin [facility])   [facility] Olin Haus[facility], DO   11 months ago Primary insomnia   [place], DO   1 year ago Annual physical exam   [facility] Olin Hauser, DO   1 year ago Controlled type 2 diabetes mellitus with diabetic nephropathy, without long-term current use of insulin ([place])   [facility] Olin Hauser, DO       Future Appointments             In 1 month Nehemiah Massed Monia Sabal, MD [place]

## 2022-11-19 ENCOUNTER — Other Ambulatory Visit: Payer: Self-pay | Admitting: Family Medicine

## 2022-11-19 DIAGNOSIS — F5101 Primary insomnia: Secondary | ICD-10-CM

## 2022-11-19 NOTE — Telephone Encounter (Signed)
Requested medication (s) are due for refill today - no  Requested medication (s) are on the active medication list -yes  Future visit scheduled -no  Last refill: 09/12/22 #30 5RF  Notes to clinic: Attempted to call patient to verify need- last Rx sent to mail order- left message to call office. Non delegated Rx  Requested Prescriptions  Pending Prescriptions Disp Refills   zolpidem (AMBIEN CR) 6.25 MG CR tablet [Pharmacy Med Name: ZOLPIDEM TART ER 6.25 MG TAB] 30 tablet     Sig: TAKE 1 TABLET BY MOUTH AT BEDTIME AS NEEDED FOR SLEEP.     Not Delegated - Psychiatry:  Anxiolytics/Hypnotics Failed - 11/19/2022  2:09 PM      Failed - This refill cannot be delegated      Failed - Urine Drug Screen completed in last 360 days      Passed - Valid encounter within last 6 months    Recent Outpatient Visits           2 months ago Annual physical exam   [facility]lin [facility], DO   8 months ago Controlled type 2 diabetes mellitus with diabetic nephropathy, without long-term current use of insulin ([place])   [facility] [place], Devonne Doughty, DO   1 year ago Primary insomnia   [place], DO   1 year ago Annual physical exam   [facility] Olin Hauser, DO   1 year ago Controlled type 2 diabetes mellitus with diabetic nephropathy, without long-term current use of insulin ([place])   [place], DO       Future Appointments             In 6 days Ralene Bathe, MD [place]               Requested Prescriptions  Pending Prescriptions Disp Refills   zolpidem (AMBIEN CR) 6.25 MG CR tablet [Pharmacy Med Name: ZOLPIDEM TART ER 6.25 MG TAB] 30 tablet     Sig: TAKE 1 TABLET BY MOUTH AT BEDTIME AS NEEDED FOR SLEEP.     Not Delegated - Psychiatry:  Anxiolytics/Hypnotics Failed - 11/19/2022  2:09 PM      Failed - This  refill cannot be delegated      Failed - Urine Drug Screen completed in last 360 days      Passed - Valid encounter within last 6 months    Recent Outpatient Visits           2 months ago Annual physical exam   [facility] Olin Hauser, DO   8 months ago Controlled type 2 diabetes mellitus with diabetic nephropathy, without long-term current use of insulin ([place])   [facility] [place], Devonne Doughty, DO   1 year ago Primary insomnia   [place], DO   1 year ago Annual physical exam   [facility] Olin Hauser, DO   1 year ago Controlled type 2 diabetes mellitus with diabetic nephropathy, without long-term current use of insulin ([place])   [place], Devonne Doughty, DO       Future Appointments             In 6 days Ralene Bathe, MD [place]

## 2022-11-19 NOTE — Telephone Encounter (Signed)
Patient called in states she is good with the med, [facility]

## 2022-11-19 NOTE — Telephone Encounter (Signed)
Requested medication (s) are due for refill today - no  Requested medication (s) are on the active medication list -yes  Future visit scheduled -no  Last refill: 09/12/22 #30 5RF  Notes to clinic: non delegated Rx- change in pharmacy  Requested Prescriptions  Pending Prescriptions Disp Refills   zolpidem (AMBIEN CR) 6.25 MG CR tablet [Pharmacy Med Name: ZOLPIDEM TART ER 6.25 MG TAB] 30 tablet     Sig: TAKE 1 TABLET BY MOUTH AT BEDTIME AS NEEDED FOR SLEEP.     Not Delegated - Psychiatry:  Anxiolytics/Hypnotics Failed - 11/19/2022  3:58 PM      Failed - This refill cannot be delegated      Failed - Urine Drug Screen completed in last 360 days      Passed - Valid encounter within last 6 months    Recent Outpatient Visits           2 months ago Annual physical exam   [facility] [place], Devonne Doughty, DO   8 months ago Controlled type 2 diabetes mellitus with diabetic nephropathy, without long-term current use of insulin ([place])   [facility] [place], Devonne Doughty, DO   1 year ago Primary insomnia   [place], DO   1 year ago Annual physical exam   [facility] Olin Hauser, DO   1 year ago Controlled type 2 diabetes mellitus with diabetic nephropathy, without long-term current use of insulin ([place])   [place], DO       Future Appointments             In 6 days Ralene Bathe, MD [place]               Requested Prescriptions  Pending Prescriptions Disp Refills   zolpidem (AMBIEN CR) 6.25 MG CR tablet [Pharmacy Med Name: ZOLPIDEM TART ER 6.25 MG TAB] 30 tablet     Sig: TAKE 1 TABLET BY MOUTH AT BEDTIME AS NEEDED FOR SLEEP.     Not Delegated - Psychiatry:  Anxiolytics/Hypnotics Failed - 11/19/2022  3:58 PM      Failed - This refill cannot be delegated      Failed - Urine Drug Screen completed in last 360  days      Passed - Valid encounter within last 6 months    Recent Outpatient Visits           2 months ago Annual physical exam   [facility] Olin Hauser, DO   8 months ago Controlled type 2 diabetes mellitus with diabetic nephropathy, without long-term current use of insulin ([place])   [facility] [place], Devonne Doughty, DO   1 year ago Primary insomnia   [place], DO   1 year ago Annual physical exam   [facility] Olin Hauser, DO   1 year ago Controlled type 2 diabetes mellitus with diabetic nephropathy, without long-term current use of insulin ([place])   [place], Devonne Doughty, DO       Future Appointments             In 6 days Ralene Bathe, MD [place]

## 2022-11-20 ENCOUNTER — Ambulatory Visit (INDEPENDENT_AMBULATORY_CARE_PROVIDER_SITE_OTHER): Payer: Medicare Other

## 2022-11-20 DIAGNOSIS — Z23 Encounter for immunization: Secondary | ICD-10-CM | POA: Diagnosis not present

## 2022-11-25 ENCOUNTER — Ambulatory Visit: Payer: Medicare Other | Admitting: Dermatology

## 2022-11-25 DIAGNOSIS — L814 Other melanin hyperpigmentation: Secondary | ICD-10-CM | POA: Diagnosis not present

## 2022-11-25 DIAGNOSIS — Z1283 Encounter for screening for malignant neoplasm of skin: Secondary | ICD-10-CM

## 2022-11-25 DIAGNOSIS — D1801 Hemangioma of skin and subcutaneous tissue: Secondary | ICD-10-CM

## 2022-11-25 DIAGNOSIS — L578 Other skin changes due to chronic exposure to nonionizing radiation: Secondary | ICD-10-CM | POA: Diagnosis not present

## 2022-11-25 DIAGNOSIS — D229 Melanocytic nevi, unspecified: Secondary | ICD-10-CM

## 2022-11-25 DIAGNOSIS — Z86018 Personal history of other benign neoplasm: Secondary | ICD-10-CM

## 2022-11-25 DIAGNOSIS — L821 Other seborrheic keratosis: Secondary | ICD-10-CM

## 2022-11-25 NOTE — Patient Instructions (Signed)
Due to recent changes in healthcare laws, you may see results of your pathology and/or laboratory studies on MyChart before the doctors have had a chance to review them. We understand that in some cases there may be results that are confusing or concerning to you. Please understand that not all results are received at the same time and often the doctors may need to interpret multiple results in order to provide you with the best plan of care or course of treatment. Therefore, we ask that you please give us 2 business days to thoroughly review all your results before contacting the office for clarification. Should we see a critical lab result, you will be contacted sooner.   If You Need Anything After Your Visit  If you have any questions or concerns for your doctor, please call our main line at [phone] and press option 4 to reach your doctor's medical assistant. If no one answers, please leave a voicemail as directed and we will return your call as soon as possible. Messages left after 4 pm will be answered the following business day.   You may also send us a message via MyChart. We typically respond to MyChart messages within 1-2 business days.  For prescription refills, please ask your pharmacy to contact our office. Our fax number is [phone].  If you have an urgent issue when the clinic is closed that cannot wait until the next business day, you can page your doctor at the number below.    Please note that while we do our best to be available for urgent issues outside of office hours, we are not available 24/7.   If you have an urgent issue and are unable to reach us, you may choose to seek medical care at your doctor's office, retail clinic, urgent care center, or emergency room.  If you have a medical emergency, please immediately call 911 or go to the emergency department.  Pager Numbers  - Dr. Kowalski: [phone]  - Dr. Moye: [phone]  - Dr. Stewart:  [phone]  In the event of inclement weather, please call our main line at [phone] for an update on the status of any delays or closures.  Dermatology Medication Tips: Please keep the boxes that topical medications come in in order to help keep track of the instructions about where and how to use these. Pharmacies typically print the medication instructions only on the boxes and not directly on the medication tubes.   If your medication is too expensive, please contact our office at [phone] option 4 or send us a message through MyChart.   We are unable to tell what your co-pay for medications will be in advance as this is different depending on your insurance coverage. However, we may be able to find a substitute medication at lower cost or fill out paperwork to get insurance to cover a needed medication.   If a prior authorization is required to get your medication covered by your insurance company, please allow us 1-2 business days to complete this process.  Drug prices often vary depending on where the prescription is filled and some pharmacies may offer cheaper prices.  The website www.goodrx.com contains coupons for medications through different pharmacies. The prices here do not account for what the cost may be with help from insurance (it may be cheaper with your insurance), but the website can give you the price if you did not use any insurance.  - You can print the associated coupon and take it with   your prescription to the pharmacy.  - You may also stop by our office during regular business hours and pick up a GoodRx coupon card.  - If you need your prescription sent electronically to a different pharmacy, notify our office through [place] MyChart or by phone at [phone] option 4.     Si Usted Necesita Algo Despus de Su Visita  Tambin puede enviarnos un mensaje a travs de MyChart. Por lo general respondemos a los mensajes de MyChart en el transcurso de 1 a 2  das hbiles.  Para renovar recetas, por favor pida a su farmacia que se ponga en contacto con nuestra oficina. Nuestro nmero de fax es el [phone].  Si tiene un asunto urgente cuando la clnica est cerrada y que no puede esperar hasta el siguiente da hbil, puede llamar/localizar a su doctor(a) al nmero que aparece a continuacin.   Por favor, tenga en cuenta que aunque hacemos todo lo posible para estar disponibles para asuntos urgentes fuera del horario de oficina, no estamos disponibles las 24 horas del da, los 7 das de la semana.   Si tiene un problema urgente y no puede comunicarse con nosotros, puede optar por buscar atencin mdica  en el consultorio de su doctor(a), en una clnica privada, en un centro de atencin urgente o en una sala de emergencias.  Si tiene una emergencia mdica, por favor llame inmediatamente al 911 o vaya a la sala de emergencias.  Nmeros de bper  - Dr. Kowalski: [phone]  - Dra. Moye: [phone]  - Dra. Stewart: [phone]  En caso de inclemencias del tiempo, por favor llame a nuestra lnea principal al [phone] para una actualizacin sobre el estado de cualquier retraso o cierre.  Consejos para la medicacin en dermatologa: Por favor, guarde las cajas en las que vienen los medicamentos de uso tpico para ayudarle a seguir las instrucciones sobre dnde y cmo usarlos. Las farmacias generalmente imprimen las instrucciones del medicamento slo en las cajas y no directamente en los tubos del medicamento.   Si su medicamento es muy caro, por favor, pngase en contacto con nuestra oficina llamando al [phone] y presione la opcin 4 o envenos un mensaje a travs de MyChart.   No podemos decirle cul ser su copago por los medicamentos por adelantado ya que esto es diferente dependiendo de la cobertura de su seguro. Sin embargo, es posible que podamos encontrar un medicamento sustituto a menor costo o llenar un formulario para que el  seguro cubra el medicamento que se considera necesario.   Si se requiere una autorizacin previa para que su compaa de seguros cubra su medicamento, por favor permtanos de 1 a 2 das hbiles para completar este proceso.  Los precios de los medicamentos varan con frecuencia dependiendo del lugar de dnde se surte la receta y alguna farmacias pueden ofrecer precios ms baratos.  El sitio web www.goodrx.com tiene cupones para medicamentos de diferentes farmacias. Los precios aqu no tienen en cuenta lo que podra costar con la ayuda del seguro (puede ser ms barato con su seguro), pero el sitio web puede darle el precio si no utiliz ningn seguro.  - Puede imprimir el cupn correspondiente y llevarlo con su receta a la farmacia.  - Tambin puede pasar por nuestra oficina durante el horario de atencin regular y recoger una tarjeta de cupones de GoodRx.  - Si necesita que su receta se enve electrnicamente a una farmacia diferente, informe a nuestra oficina a travs de MyChart de [place]   o por telfono llamando al [phone] y presione la opcin 4.  

## 2022-11-25 NOTE — Progress Notes (Signed)
   Follow-Up Visit   Subjective  Holly Steele is a 67 y.o. female who presents for the following: Annual Exam (History of dysplastic nevus - The patient presents for Total-Body Skin Exam (TBSE) for skin cancer screening and mole check.  The patient has spots, moles and lesions to be evaluated, some may be new or changing and the patient has concerns that these could be cancer./).  The following portions of the chart were reviewed this encounter and updated as appropriate:   Tobacco  Allergies  Meds  Problems  Med Hx  Surg Hx  Fam Hx     Review of Systems:  No other skin or systemic complaints except as noted in HPI or Assessment and Plan.  Objective  Well appearing patient in no apparent distress; mood and affect are within normal limits.  A full examination was performed including scalp, head, eyes, ears, nose, lips, neck, chest, axillae, abdomen, back, buttocks, bilateral upper extremities, bilateral lower extremities, hands, feet, fingers, toes, fingernails, and toenails. All findings within normal limits unless otherwise noted below.  Left lat buttock Well healed biopsy site   Assessment & Plan   Lentigines - Scattered tan macules - Due to sun exposure - Benign-appearing, observe - Recommend daily broad spectrum sunscreen SPF 30+ to sun-exposed areas, reapply every 2 hours as needed. - Call for any changes  Seborrheic Keratoses - Stuck-on, waxy, tan-brown papules and/or plaques  - Benign-appearing - Discussed benign etiology and prognosis. - Observe - Call for any changes  Melanocytic Nevi - Tan-brown and/or pink-flesh-colored symmetric macules and papules - Benign appearing on exam today - Observation - Call clinic for new or changing moles - Recommend daily use of broad spectrum spf 30+ sunscreen to sun-exposed areas.   Hemangiomas - Red papules - Discussed benign nature - Observe - Call for any changes  Actinic Damage - Chronic condition, secondary  to cumulative UV/sun exposure - diffuse scaly erythematous macules with underlying dyspigmentation - Recommend daily broad spectrum sunscreen SPF 30+ to sun-exposed areas, reapply every 2 hours as needed.  - Staying in the shade or wearing long sleeves, sun glasses (UVA+UVB protection) and wide brim hats (4-inch brim around the entire circumference of the hat) are also recommended for sun protection.  - Call for new or changing lesions.  Skin cancer screening performed today.  History of dysplastic nevus Left lat buttock  Clear. Observe for recurrence. Call clinic for new or changing lesions.  Recommend regular skin exams, daily broad-spectrum spf 30+ sunscreen use, and photoprotection.      Return in about 1 year (around 11/26/2023) for TBSE.  I, Ashok Cordia, CMA, am acting as scribe for Sarina Ser, MD . Documentation: I have reviewed the above documentation for accuracy and completeness, and I agree with the above.  Sarina Ser, MD

## 2022-12-04 ENCOUNTER — Encounter: Payer: Self-pay | Admitting: Dermatology

## 2023-01-06 ENCOUNTER — Ambulatory Visit (INDEPENDENT_AMBULATORY_CARE_PROVIDER_SITE_OTHER): Payer: Medicare Other | Admitting: Internal Medicine

## 2023-01-06 ENCOUNTER — Ambulatory Visit: Payer: Self-pay

## 2023-01-06 ENCOUNTER — Encounter: Payer: Self-pay | Admitting: Internal Medicine

## 2023-01-06 DIAGNOSIS — J4521 Mild intermittent asthma with (acute) exacerbation: Secondary | ICD-10-CM

## 2023-01-06 MED ORDER — ALBUTEROL SULFATE HFA 108 (90 BASE) MCG/ACT IN AERS: 2.0000 | INHALATION_SPRAY | RESPIRATORY_TRACT | 0 refills | Status: DC | PRN

## 2023-01-06 MED ORDER — PREDNISONE 10 MG PO TABS: 10.0000 mg | ORAL_TABLET | Freq: Every day | ORAL | 0 refills | Status: DC

## 2023-01-06 NOTE — Patient Instructions (Signed)
Asthma Triggers and Exacerbation In this video, you will learn about exposures and other factors that can lead to asthma attacks and trouble controlling asthma. To view the content, go to this web address: [email or web address]  This video will expire on: 10/16/2024. If you need access to this video following this date, please reach out to the healthcare provider who assigned it to you. This information is not intended to replace advice given to you by your health care provider. Make sure you discuss any questions you have with your health care provider. Elsevier Patient Education  [place].

## 2023-01-06 NOTE — Progress Notes (Signed)
Subjective:    Patient ID: Holly Steele, female    DOB: 1956-11-04, 67 y.o.   MRN: [id number]  HPI  Pt presents to the clinic today with c/o  headache, sore throat cough, chest congestion and shortness of breath. This started 3 weeks ago. She denies difficulty swallowing. The cough is productive of clear mucous.  She denies runny nose, nasal congestion, ear pain, chest pain, nausea, vomiting or diarrhea.  She denies fever, chills or body aches.  She has tried nasal spray, Sucret lozenges OTC with minimal relief of symptoms. She has had 2 home negative covid test. She has had sick contacts with similar symptoms. She has had a history of asthma in the past.  Review of Systems     Past Medical History:  Diagnosis Date   Anxiety    Asthma    past   Dysplastic nevus 08/14/2020   L lat buttocks. Mild atypia, close to margin.   Osteoporosis     Current Outpatient Medications  Medication Sig Dispense Refill   albuterol (VENTOLIN HFA) 108 (90 Base) MCG/ACT inhaler Inhale 2 puffs into the lungs every 4 (four) hours as needed for wheezing or shortness of breath (cough). 6.7 g 2   alendronate (FOSAMAX) 70 MG tablet Take 1 tablet (70 mg total) by mouth once a week. 12 tablet 3   calcium-vitamin D (OSCAL WITH D) 500-200 MG-UNIT tablet Take 1 tablet by mouth.     Continuous Blood Gluc Receiver (DEXCOM G6 RECEIVER) DEVI      Continuous Blood Gluc Sensor (DEXCOM G6 SENSOR) MISC USE SENSOR TO CHECK BLOOD  SUGAR AS DIRECTED. Replace sensor every 10 days. 9 each 3   Continuous Blood Gluc Transmit (DEXCOM G6 TRANSMITTER) MISC USE TO CHECK BLOOD SUGAR ASDIRECTED 1 each 3   escitalopram (LEXAPRO) 10 MG tablet Take 1 tablet (10 mg total) by mouth daily. 90 tablet 3   estradiol (ESTRACE) 0.1 MG/GM vaginal cream Place 1 Applicatorful vaginally at bedtime. 90 g 3   JARDIANCE 25 MG TABS tablet 12.5 mg. Take half tablet daily 90 tablet 3   magnesium oxide (MAG-OX) 400 MG tablet Take 400 mg by mouth daily.      ONETOUCH VERIO test strip Check blood sugar up to 1-2 times daily as advised. 150 each 3   OZEMPIC, 1 MG/DOSE, 4 MG/3ML SOPN Inject 1 mg into the skin once a week. 9 mL 3   REPATHA 140 MG/ML SOSY Inject 140 mg into the skin every 14 (fourteen) days. 6 mL 3   vitamin B-12 (CYANOCOBALAMIN) 1000 MCG tablet Take 1,000 mcg by mouth every other day.     zolpidem (AMBIEN CR) 6.25 MG CR tablet TAKE 1 TABLET BY MOUTH AT BEDTIME AS NEEDED FOR SLEEP. 30 tablet 2   No current facility-administered medications for this visit.    Allergies  Allergen Reactions   Keflet [Cephalexin]     Family History  Problem Relation Age of Onset   Hypertension Mother    Alcohol abuse Father    Diabetes Father    Diabetes Sister    Diabetes Brother     Social History   Socioeconomic History   Marital status: Married    Spouse name: Not on file   Number of children: Not on file   Years of education: Not on file   Highest education level: Not on file  Occupational History   Not on file  Tobacco Use   Smoking status: Never   Smokeless tobacco: Never  Vaping Use   Vaping Use: Never used  Substance and Sexual Activity   Alcohol use: Yes    Comment: Social    Drug use: Never   Sexual activity: Not Currently    Birth control/protection: Post-menopausal  Other Topics Concern   Not on file  Social History Narrative   Not on file   Social Determinants of Health   Financial Resource Strain: Not on file  Food Insecurity: Not on file  Transportation Needs: Not on file  Physical Activity: Not on file  Stress: Not on file  Social Connections: Not on file  Intimate Partner Violence: Not on file     Constitutional: Denies fever, malaise, fatigue, headache or abrupt weight changes.  HEENT: Pt reports sore throat. Denies eye pain, eye redness, ear pain, ringing in the ears, wax buildup, runny nose, nasal congestion, bloody nose. Respiratory: Pt reports cough and shortness of breath. Denies difficulty  breathing, or sputum production.   Cardiovascular: Denies chest pain, chest tightness, palpitations or swelling in the hands or feet.  Gastrointestinal: Denies abdominal pain, bloating, constipation, diarrhea or blood in the stool.   No other specific complaints in a complete review of systems (except as listed in HPI above).  Objective:   Physical Exam   BP 122/60 (BP Location: Right Arm, Patient Position: Sitting, Cuff Size: Normal)   Pulse 88   Temp (!) 96.8 F (36 C) (Temporal)   Wt 125 lb (56.7 kg)   SpO2 100%   BMI 20.80 kg/m   Wt Readings from Last 3 Encounters:  09/12/22 125 lb 3.2 oz (56.8 kg)  07/02/22 124 lb 4.8 oz (56.4 kg)  03/06/22 125 lb 9.6 oz (57 kg)    General: Appears her stated age, well developed, well nourished in NAD. Skin: Warm, dry and intact.  HEENT: Head: normal shape and size, no sinus tenderness noted; Eyes: sclera white, no icterus, conjunctiva pink, PERRLA and EOMs intact; Throat/Mouth: Teeth present, mucosa erythematous and moist, no exudate, lesions or ulcerations noted.  Neck: No adenopathy noted. Cardiovascular: Normal rate and rhythm. S1,S2 noted.  No murmur, rubs or gallops noted.  Pulmonary/Chest: Normal effort and positive vesicular breath sounds. No respiratory distress. No wheezes, rales or ronchi noted.  Neurological: Alert and oriented.   BMET    Component Value Date/Time   NA 136 09/06/2022 1044   NA 141 10/26/2019 0000   K 4.4 09/06/2022 1044   CL 100 09/06/2022 1044   CO2 27 09/06/2022 1044   GLUCOSE 108 (H) 09/06/2022 1044   BUN 20 09/06/2022 1044   BUN 18 10/26/2019 0000   CREATININE 1.01 09/06/2022 1044   CALCIUM 9.9 09/06/2022 1044   GFRNONAA 50 (L) 09/12/2020 0820   GFRAA 58 (L) 09/12/2020 0820    Lipid Panel     Component Value Date/Time   CHOL 190 09/06/2022 1044   TRIG 139 09/06/2022 1044   HDL 52 09/06/2022 1044   CHOLHDL 3.7 09/06/2022 1044   LDLCALC 113 (H) 09/06/2022 1044    CBC    Component Value  Date/Time   WBC 4.9 09/06/2022 1044   RBC 4.75 09/06/2022 1044   HGB 14.2 09/06/2022 1044   HCT 41.7 09/06/2022 1044   PLT 291 09/06/2022 1044   MCV 87.8 09/06/2022 1044   MCH 29.9 09/06/2022 1044   MCHC 34.1 09/06/2022 1044   RDW 12.1 09/06/2022 1044   LYMPHSABS 1,524 09/06/2022 1044   EOSABS 88 09/06/2022 1044   BASOSABS 69 09/06/2022 1044  Hgb A1C Lab Results  Component Value Date   HGBA1C 6.5 (H) 09/06/2022           Assessment & Plan:   Asthma Exacerbation:  Rx for Prednisone 10 mg daily x 5 days Advised her to monitor her sugars as Prednisone is known to cause increased blood glucose Refilled her Albuterol today No indication for antibiotics or imaging at this time  Follow up with your PCP as previously scheduled. Webb Silversmith, NP

## 2023-01-06 NOTE — Telephone Encounter (Signed)
     Chief Complaint: Cough, fatigue, chest tightness. Symptoms: Above  Frequency  3 weeks ago Pertinent Negatives: Patient denies fever or wheezing Disposition: '[]'$ ED /'[]'$ Urgent Care (no appt availability in office) / '[x]'$ Appointment(In office/virtual)/ '[]'$  [place]/ '[]'$ Home Care/ '[]'$ Refused Recommended Disposition /'[]'$  Mobile Bus/ '[]'$  Follow-up with PCP Additional Notes:   Answer Assessment - Initial Assessment Questions 1. ONSET: "When did the cough begin?"      3 weeks 2. SEVERITY: "How bad is the cough today?"      Moderate 3. SPUTUM: "Describe the color of your sputum" (none, dry cough; clear, white, yellow, green)     Clear 4. HEMOPTYSIS: "Are you coughing up any blood?" If so ask: "How much?" (flecks, streaks, tablespoons, etc.)     No 5. DIFFICULTY BREATHING: "Are you having difficulty breathing?" If Yes, ask: "How bad is it?" (e.g., mild, moderate, severe)    - MILD: No SOB at rest, mild SOB with walking, speaks normally in sentences, can lie down, no retractions, pulse < 100.    - MODERATE: SOB at rest, SOB with minimal exertion and prefers to sit, cannot lie down flat, speaks in phrases, mild retractions, audible wheezing, pulse 100-120.    - SEVERE: Very SOB at rest, speaks in single words, struggling to breathe, sitting hunched forward, retractions, pulse > 120      Mild  6. FEVER: "Do you have a fever?" If Yes, ask: "What is your temperature, how was it measured, and when did it start?"     No 7. CARDIAC HISTORY: "Do you have any history of heart disease?" (e.g., heart attack, congestive heart failure)      No 8. LUNG HISTORY: "Do you have any history of lung disease?"  (e.g., pulmonary embolus, asthma, emphysema)     Asthma 9. PE RISK FACTORS: "Do you have a history of blood clots?" (or: recent major surgery, recent prolonged travel, bedridden)     No 10. OTHER SYMPTOMS: "Do you have any other symptoms?" (e.g., runny nose, wheezing, chest pain)        Sore throat 11. PREGNANCY: "Is there any chance you are pregnant?" "When was your last menstrual period?"       No 12. TRAVEL: "Have you traveled out of the country in the last month?" (e.g., travel history, exposures)       No  Protocols used: Cough - Acute Non-Productive-A-AH

## 2023-01-15 ENCOUNTER — Encounter (INDEPENDENT_AMBULATORY_CARE_PROVIDER_SITE_OTHER): Payer: Medicare Other | Admitting: Family Medicine

## 2023-01-15 DIAGNOSIS — F5101 Primary insomnia: Secondary | ICD-10-CM

## 2023-01-16 MED ORDER — ESZOPICLONE 2 MG PO TABS: 2.0000 mg | ORAL_TABLET | Freq: Every evening | ORAL | 0 refills | Status: DC | PRN

## 2023-01-16 NOTE — Telephone Encounter (Signed)
Please see the MyChart message reply(ies) for my assessment and plan.    This patient gave consent for this Medical Advice Message and is aware that it may result in a bill to their insurance company, as well as the possibility of receiving a bill for a co-payment or deductible. They are an established patient, but are not seeking medical advice exclusively about a problem treated during an in person or video visit in the last seven days. I did not recommend an in person or video visit within seven days of my reply.    I spent a total of 8 minutes cumulative time within 7 days through MyChart messaging.  Zia Kanner, DO   

## 2023-01-16 NOTE — Addendum Note (Signed)
Addended by: Olin Hauser on: 01/16/2023 06:59 PM   Modules accepted: Orders

## 2023-02-04 ENCOUNTER — Other Ambulatory Visit: Payer: Self-pay | Admitting: Family Medicine

## 2023-02-04 DIAGNOSIS — N1831 Chronic kidney disease, stage 3a: Secondary | ICD-10-CM

## 2023-02-04 DIAGNOSIS — E1121 Type 2 diabetes mellitus with diabetic nephropathy: Secondary | ICD-10-CM

## 2023-02-04 MED ORDER — JARDIANCE 10 MG PO TABS: 10.0000 mg | ORAL_TABLET | Freq: Every day | ORAL | 3 refills | Status: DC

## 2023-02-04 NOTE — Progress Notes (Signed)
Dose reduced Jardiance 25mg  > half tab daily down to new rx Jardiance 10mg  daily  Nobie Putnam, DO [facility] 02/04/2023, 4:55 PM

## 2023-02-14 ENCOUNTER — Other Ambulatory Visit: Payer: Self-pay | Admitting: Internal Medicine

## 2023-02-14 ENCOUNTER — Other Ambulatory Visit: Payer: Self-pay | Admitting: Family Medicine

## 2023-02-14 DIAGNOSIS — E1165 Type 2 diabetes mellitus with hyperglycemia: Secondary | ICD-10-CM

## 2023-02-17 NOTE — Telephone Encounter (Signed)
Requested Prescriptions  Pending Prescriptions Disp Refills   Continuous Blood Gluc Sensor (DEXCOM G6 SENSOR) MISC [Pharmacy Med Name: DEXCOM G6 MIS SENSOR] 9 each 0    Sig: USE SENSOR TO CHECK BLOOD  SUGAR AS DIRECTED. REPLACE SENSOR EVERY 10 DAYS.     Endocrinology: Diabetes - Testing Supplies Passed - 02/14/2023 11:37 PM      Passed - Valid encounter within last 12 months    Recent Outpatient Visits           1 month ago Mild intermittent asthma with exacerbation   [place] [facility] [place], Salvadore Oxford, NP   5 months ago Annual physical exam   [place] [facility] Smitty Cords, DO   11 months ago Controlled type 2 diabetes mellitus with diabetic nephropathy, without long-term current use of insulin [facility])   [place] [facility] Smitty Cords, DO   1 year ago Primary insomnia   [place] [facility] Smitty Cords, DO   1 year ago Annual physical exam   [place] [facility] Smitty Cords, DO       Future Appointments             In 9 months Deirdre Evener, MD [facility]

## 2023-02-17 NOTE — Telephone Encounter (Signed)
Requested medication (s) are due for refill today: yes  Requested medication (s) are on the active medication list: yes  Last refill:  10/05/21  Future visit scheduled: yes  Notes to clinic:  Unable to refill per protocol, last refill by another provider.  Historical medication, routing for review.     Requested Prescriptions  Pending Prescriptions Disp Refills   Continuous Blood Gluc Transmit (DEXCOM G6 TRANSMITTER) MISC [Pharmacy Med Name: DEXCOM G6 MIS TRANSMIT] 1 each 3    Sig: USE TO CHECK BLOOD SUGAR ASDIRECTED     Endocrinology: Diabetes - Testing Supplies Passed - 02/14/2023 11:37 PM      Passed - Valid encounter within last 12 months    Recent Outpatient Visits           1 month ago Mild intermittent asthma with exacerbation   [facility] [place], Salvadore Oxford, NP   5 months ago Annual physical exam   [place] [facility] Smitty Cords, DO   11 months ago Controlled type 2 diabetes mellitus with diabetic nephropathy, without long-term current use of insulin [facility])   [place] [facility] Smitty Cords, DO   1 year ago Primary insomnia   [place] [facility] Smitty Cords, DO   1 year ago Annual physical exam   [place] [facility] Smitty Cords, DO       Future Appointments             In 9 months Deirdre Evener, MD [facility] [place]

## 2023-04-08 ENCOUNTER — Encounter: Payer: Self-pay | Admitting: Family Medicine

## 2023-04-08 ENCOUNTER — Ambulatory Visit (INDEPENDENT_AMBULATORY_CARE_PROVIDER_SITE_OTHER): Payer: Medicare Other | Admitting: Family Medicine

## 2023-04-08 VITALS — BP 110/62 | HR 81 | Temp 98.5°F | Resp 18 | Ht 65.0 in | Wt 132.0 lb

## 2023-04-08 DIAGNOSIS — Z23 Encounter for immunization: Secondary | ICD-10-CM | POA: Diagnosis not present

## 2023-04-08 DIAGNOSIS — Z1211 Encounter for screening for malignant neoplasm of colon: Secondary | ICD-10-CM | POA: Diagnosis not present

## 2023-04-08 DIAGNOSIS — Z0000 Encounter for general adult medical examination without abnormal findings: Secondary | ICD-10-CM | POA: Diagnosis not present

## 2023-04-08 NOTE — Progress Notes (Signed)
Patient: Holly Steele, Female    DOB: Sep 11, 1956, 67 y.o.   MRN: [id number]  Visit Date: 04/08/2023  Today's Provider: Saralyn Pilar, DO   Chief Complaint  Patient presents with   Medicare Wellness    Subjective:   Holly Steele is a 67 y.o. female who presents today for "Welcome to Medicare Visit"  Welcome to [employer] Wellness Visit:   HPI  Here for welcome to medicare visit  Agrees to have screening EKG today. No prior EKG on file  Lifestyle: - Diet: balanced, improving - Exercise: walking dog, gym  Additional concerns today:  She stopped sleeping medication due to side effect She is trying to naturally work on sleeping improvement  Due for Cologuard after 04/28/23,  last done 04/2020 Cologuard negative.  Review of Systems  See above  Past Medical History:  Diagnosis Date   Anxiety    Asthma    past   Dysplastic nevus 08/14/2020   L lat buttocks. Mild atypia, close to margin.   Osteoporosis     Past Surgical History:  Procedure Laterality Date   POLYPECTOMY      Family History  Problem Relation Age of Onset   Hypertension Mother    Alcohol abuse Father    Diabetes Father    Diabetes Sister    Diabetes Brother     Social History   Socioeconomic History   Marital status: Married    Spouse name: Not on file   Number of children: Not on file   Years of education: Not on file   Highest education level: Not on file  Occupational History   Occupation: Retired  Tobacco Use   Smoking status: Never   Smokeless tobacco: Never  Vaping Use   Vaping Use: Never used  Substance and Sexual Activity   Alcohol use: Yes    Comment: Social    Drug use: Never   Sexual activity: Not Currently    Birth control/protection: Post-menopausal  Other Topics Concern   Not on file  Social History Narrative   Not on file   Social Determinants of Health   Financial Resource Strain: Low Risk  (04/08/2023)   Overall Financial Resource Strain (CARDIA)     Difficulty of Paying Living Expenses: Not hard at all  Food Insecurity: No Food Insecurity (04/08/2023)   Hunger Vital Sign    Worried About Running Out of Food in the Last Year: Never true    Ran Out of Food in the Last Year: Never true  Transportation Needs: No Transportation Needs (04/08/2023)   PRAPARE - Administrator, Civil Service (Medical): No    Lack of Transportation (Non-Medical): No  Physical Activity: Sufficiently Active (04/08/2023)   Exercise Vital Sign    Days of Exercise per Week: 5 days    Minutes of Exercise per Session: 80 min  Stress: No Stress Concern Present (04/08/2023)   [employer] of Occupational Health - Occupational Stress Questionnaire    Feeling of Stress : Only a little  Social Connections: Moderately Isolated (04/08/2023)   Social Connection and Isolation Panel [NHANES]    Frequency of Communication with Friends and Family: More than three times a week    Frequency of Social Gatherings with Friends and Family: Once a week    Attends Religious Services: Never    Database administrator or Organizations: No    Attends Banker Meetings: Never    Marital Status: Married  Catering manager Violence: Not At Risk (04/08/2023)  Humiliation, Afraid, Rape, and Kick questionnaire    Fear of Current or Ex-Partner: No    Emotionally Abused: No    Physically Abused: No    Sexually Abused: No    Outpatient Encounter Medications as of 04/08/2023  Medication Sig   albuterol (VENTOLIN HFA) 108 (90 Base) MCG/ACT inhaler Inhale 2 puffs into the lungs every 4 (four) hours as needed for wheezing or shortness of breath (cough).   alendronate (FOSAMAX) 70 MG tablet Take 1 tablet (70 mg total) by mouth once a week.   calcium-vitamin D (OSCAL WITH D) 500-200 MG-UNIT tablet Take 1 tablet by mouth.   Continuous Blood Gluc Receiver (DEXCOM G6 RECEIVER) DEVI    Continuous Blood Gluc Sensor (DEXCOM G6 SENSOR) MISC USE SENSOR TO CHECK BLOOD  SUGAR AS  DIRECTED. REPLACE SENSOR EVERY 10 DAYS.   Continuous Glucose Transmitter (DEXCOM G6 TRANSMITTER) MISC USE TO CHECK BLOOD SUGAR ASDIRECTED   escitalopram (LEXAPRO) 10 MG tablet Take 1 tablet (10 mg total) by mouth daily.   estradiol (ESTRACE) 0.1 MG/GM vaginal cream Place 1 Applicatorful vaginally at bedtime.   JARDIANCE 10 MG TABS tablet Take 1 tablet (10 mg total) by mouth daily before breakfast.   magnesium oxide (MAG-OX) 400 MG tablet Take 400 mg by mouth daily.   ONETOUCH VERIO test strip Check blood sugar up to 1-2 times daily as advised.   OZEMPIC, 1 MG/DOSE, 4 MG/3ML SOPN Inject 1 mg into the skin once a week.   REPATHA 140 MG/ML SOSY Inject 140 mg into the skin every 14 (fourteen) days.   vitamin B-12 (CYANOCOBALAMIN) 1000 MCG tablet Take 1,000 mcg by mouth every other day.   [DISCONTINUED] eszopiclone (LUNESTA) 2 MG TABS tablet Take 1 tablet (2 mg total) by mouth at bedtime as needed for sleep. Take immediately before bedtime (Patient not taking: Reported on 04/08/2023)   [DISCONTINUED] predniSONE (DELTASONE) 10 MG tablet Take 1 tablet (10 mg total) by mouth daily with breakfast. (Patient not taking: Reported on 04/08/2023)   No facility-administered encounter medications on file as of 04/08/2023.      Functional Status Survey: Is the patient deaf or have difficulty hearing?: No Does the patient have difficulty seeing, even when wearing glasses/contacts?: No Does the patient have difficulty concentrating, remembering, or making decisions?: No Does the patient have difficulty walking or climbing stairs?: No Does the patient have difficulty dressing or bathing?: No Does the patient have difficulty doing errands alone such as visiting a doctor's office or shopping?: No   Fall Risk Assessment    09/12/2022    9:23 AM 03/06/2022   10:08 AM 10/23/2021    1:40 PM 06/20/2021    8:25 AM 09/19/2020    8:07 AM  Fall Risk   Falls in the past year? 0 0 0 0 0  Number falls in past yr: 0 0 0 0 0   Injury with Fall? 0 0 0 0 0  Risk for fall due to : No Fall Risks No Fall Risks     Follow up Falls evaluation completed Falls evaluation completed Falls evaluation completed Falls evaluation completed Falls evaluation completed    Depression Screen See under rooming    04/08/2023    2:16 PM 04/08/2023    2:15 PM 09/12/2022    9:23 AM 03/06/2022   10:08 AM 10/23/2021    1:40 PM  Depression screen PHQ 2/9  Decreased Interest 0 0 0 0 0  Down, Depressed, Hopeless 0 0 0 0 0  PHQ -  2 Score 0 0 0 0 0  Altered sleeping 1  2 2 2   Tired, decreased energy 1  0 1 0  Change in appetite 0  0 0 0  Feeling bad or failure about yourself  0  0 0 0  Trouble concentrating 0  0 0 0  Moving slowly or fidgety/restless 0  0 0 0  Suicidal thoughts 0  0 0 0  PHQ-9 Score 2  2 3 2   Difficult doing work/chores Not difficult at all  Not difficult at all Not difficult at all Not difficult at all     Objective:   Vitals: BP 110/62 (BP Location: Left Arm, Patient Position: Sitting, Cuff Size: Normal)   Pulse 81   Temp 98.5 F (36.9 C) (Oral)   Resp 18   Ht 5\' 5"  (1.651 m)   Wt 132 lb (59.9 kg)   BMI 21.97 kg/m  Body mass index is 21.97 kg/m.  Filed Weights   04/08/23 1410  Weight: 132 lb (59.9 kg)    No results found.  Physical Exam Vitals and nursing note reviewed.  Constitutional:      General: She is not in acute distress.    Appearance: Normal appearance. She is well-developed. She is not diaphoretic.     Comments: Well-appearing, comfortable, cooperative  HENT:     Head: Normocephalic and atraumatic.  Eyes:     General:        Right eye: No discharge.        Left eye: No discharge.     Conjunctiva/sclera: Conjunctivae normal.  Cardiovascular:     Rate and Rhythm: Normal rate.  Pulmonary:     Effort: Pulmonary effort is normal.  Skin:    General: Skin is warm and dry.     Findings: No erythema or rash.  Neurological:     Mental Status: She is alert and oriented to person,  place, and time.  Psychiatric:        Mood and Affect: Mood normal.        Behavior: Behavior normal.        Thought Content: Thought content normal.     Comments: Well groomed, good eye contact, normal speech and thoughts     EKG - performed in office today  Date: 04/08/23  Rate: 71  Rhythm: normal sinus rhythm  QRS Axis: normal  Intervals: normal  ST/T Wave abnormalities: normal  Conduction Disutrbances:none  Additional Narrative Interpretation: none  Old EKG Reviewed: none available        No data to display           Cognitive Testing - 6-CIT    04/08/2023    2:17 PM  6CIT Screen  What Year? 0 points  What month? 0 points  What time? 0 points  Count back from 20 0 points  Months in reverse 0 points  Repeat phrase 0 points  Total Score 0 points     Assessment & Plan:    Annual Wellness Visit  Reviewed patient's Family Medical History Reviewed and updated list of patient's medical providers Assessment of cognitive impairment was done Assessed patient's functional ability Established a written schedule for health screening services Health Risk Assessent Completed and Reviewed  Exercise Activities and Dietary recommendations  Goals   None     Immunization History  Administered Date(s) Administered   Fluad Quad(high Dose 65+) 11/20/2022   Influenza,inj,Quad PF,6+ Mos 09/19/2020, 08/13/2021   Moderna Sars-Covid-2 Vaccination 11/28/2019, 01/08/2020, 09/19/2020, 02/13/2021  Pneumococcal Polysaccharide-23 10/26/2019   Tdap 05/04/2009, 06/29/2021   Zoster Recombinat (Shingrix) 06/29/2021, 08/13/2021    Health Maintenance  Topic Date Due   Diabetic kidney evaluation - Urine ACR  04/19/2021   COVID-19 Vaccine (5 - 2023-24 season) 07/05/2022   Pneumonia Vaccine 45+ Years old (2 of 2 - PCV) 11/11/2022   HEMOGLOBIN A1C  03/07/2023   PAP SMEAR-Modifier  05/10/2023   HIV Screening  06/20/2024 (Originally 11/11/1970)   Fecal DNA (Cologuard)  04/28/2023    OPHTHALMOLOGY EXAM  05/01/2023   INFLUENZA VACCINE  06/05/2023   Diabetic kidney evaluation - eGFR measurement  09/07/2023   FOOT EXAM  09/13/2023   Medicare Annual Wellness (AWV)  04/07/2024   MAMMOGRAM  06/27/2024   DTaP/Tdap/Td (3 - Td or Tdap) 06/30/2031   DEXA SCAN  Completed   Hepatitis C Screening  Completed   Zoster Vaccines- Shingrix  Completed   HPV VACCINES  Aged Out    Discussed health benefits of physical activity, and encouraged her to engage in regular exercise appropriate for her age and condition.   No orders of the defined types were placed in this encounter.   Current Outpatient Medications:    albuterol (VENTOLIN HFA) 108 (90 Base) MCG/ACT inhaler, Inhale 2 puffs into the lungs every 4 (four) hours as needed for wheezing or shortness of breath (cough)., Disp: 6.7 g, Rfl: 0   alendronate (FOSAMAX) 70 MG tablet, Take 1 tablet (70 mg total) by mouth once a week., Disp: 12 tablet, Rfl: 3   calcium-vitamin D (OSCAL WITH D) 500-200 MG-UNIT tablet, Take 1 tablet by mouth., Disp: , Rfl:    Continuous Blood Gluc Receiver (DEXCOM G6 RECEIVER) DEVI, , Disp: , Rfl:    Continuous Blood Gluc Sensor (DEXCOM G6 SENSOR) MISC, USE SENSOR TO CHECK BLOOD  SUGAR AS DIRECTED. REPLACE SENSOR EVERY 10 DAYS., Disp: 9 each, Rfl: 0   Continuous Glucose Transmitter (DEXCOM G6 TRANSMITTER) MISC, USE TO CHECK BLOOD SUGAR ASDIRECTED, Disp: 1 each, Rfl: 3   escitalopram (LEXAPRO) 10 MG tablet, Take 1 tablet (10 mg total) by mouth daily., Disp: 90 tablet, Rfl: 3   estradiol (ESTRACE) 0.1 MG/GM vaginal cream, Place 1 Applicatorful vaginally at bedtime., Disp: 90 g, Rfl: 3   JARDIANCE 10 MG TABS tablet, Take 1 tablet (10 mg total) by mouth daily before breakfast., Disp: 90 tablet, Rfl: 3   magnesium oxide (MAG-OX) 400 MG tablet, Take 400 mg by mouth daily., Disp: , Rfl:    ONETOUCH VERIO test strip, Check blood sugar up to 1-2 times daily as advised., Disp: 150 each, Rfl: 3   OZEMPIC, 1 MG/DOSE, 4  MG/3ML SOPN, Inject 1 mg into the skin once a week., Disp: 9 mL, Rfl: 3   REPATHA 140 MG/ML SOSY, Inject 140 mg into the skin every 14 (fourteen) days., Disp: 6 mL, Rfl: 3   vitamin B-12 (CYANOCOBALAMIN) 1000 MCG tablet, Take 1,000 mcg by mouth every other day., Disp: , Rfl:  Medications Discontinued During This Encounter  Medication Reason   eszopiclone (LUNESTA) 2 MG TABS tablet    predniSONE (DELTASONE) 10 MG tablet     Next Medicare Wellness Visit in 12+ months  Saralyn Pilar, DO [facility] 04/08/2023, 2:42 PM

## 2023-04-08 NOTE — Patient Instructions (Signed)
Thank you for coming to the office today.    Please schedule a Follow-up Appointment to: No follow-ups on file.  If you have any other questions or concerns, please feel free to call the office or send a message through MyChart. You may also schedule an earlier appointment if necessary.  Additionally, you may be receiving a survey about your experience at our office within a few days to 1 week by e-mail or mail. We value your feedback.  Myrtle Haller, DO [facility] 

## 2023-04-15 ENCOUNTER — Other Ambulatory Visit: Payer: Medicare Other

## 2023-04-17 ENCOUNTER — Other Ambulatory Visit: Payer: Medicare Other

## 2023-04-17 DIAGNOSIS — E538 Deficiency of other specified B group vitamins: Secondary | ICD-10-CM

## 2023-04-17 DIAGNOSIS — E7801 Familial hypercholesterolemia: Secondary | ICD-10-CM

## 2023-04-17 DIAGNOSIS — E559 Vitamin D deficiency, unspecified: Secondary | ICD-10-CM

## 2023-04-17 DIAGNOSIS — E1121 Type 2 diabetes mellitus with diabetic nephropathy: Secondary | ICD-10-CM

## 2023-04-18 LAB — COMPLETE METABOLIC PANEL WITH GFR
AG Ratio: 1.5 (calc) (ref 1.0–2.5)
ALT: 11 U/L (ref 6–29)
AST: 16 U/L (ref 10–35)
Albumin: 4.3 g/dL (ref 3.6–5.1)
Alkaline phosphatase (APISO): 58 U/L (ref 37–153)
BUN/Creatinine Ratio: 14 (calc) (ref 6–22)
BUN: 17 mg/dL (ref 7–25)
CO2: 30 mmol/L (ref 20–32)
Calcium: 9.9 mg/dL (ref 8.6–10.4)
Chloride: 99 mmol/L (ref 98–110)
Creat: 1.18 mg/dL — ABNORMAL HIGH (ref 0.50–1.05)
Globulin: 2.9 g/dL (calc) (ref 1.9–3.7)
Glucose, Bld: 140 mg/dL — ABNORMAL HIGH (ref 65–99)
Potassium: 4.5 mmol/L (ref 3.5–5.3)
Sodium: 137 mmol/L (ref 135–146)
Total Bilirubin: 0.7 mg/dL (ref 0.2–1.2)
Total Protein: 7.2 g/dL (ref 6.1–8.1)
eGFR: 51 mL/min/{1.73_m2} — ABNORMAL LOW (ref >=60)

## 2023-04-18 LAB — LIPID PANEL
Cholesterol: 152 mg/dL (ref <200)
HDL: 50 mg/dL (ref >=50)
LDL Cholesterol (Calc): 76 mg/dL (calc)
Non-HDL Cholesterol (Calc): 102 mg/dL (calc) (ref <130)
Total CHOL/HDL Ratio: 3 (calc) (ref <5.0)
Triglycerides: 154 mg/dL — ABNORMAL HIGH (ref <150)

## 2023-04-18 LAB — HEMOGLOBIN A1C
Hgb A1c MFr Bld: 6.9 % of total Hgb — ABNORMAL HIGH (ref <5.7)
Mean Plasma Glucose: 151 mg/dL
eAG (mmol/L): 8.4 mmol/L

## 2023-04-18 LAB — VITAMIN D 25 HYDROXY (VIT D DEFICIENCY, FRACTURES): Vit D, 25-Hydroxy: 107 ng/mL — ABNORMAL HIGH (ref 30–100)

## 2023-04-18 LAB — VITAMIN B12: Vitamin B-12: 731 pg/mL (ref 200–1100)

## 2023-07-24 ENCOUNTER — Encounter: Payer: Self-pay | Admitting: Family Medicine

## 2023-07-24 DIAGNOSIS — E1121 Type 2 diabetes mellitus with diabetic nephropathy: Secondary | ICD-10-CM

## 2023-07-24 MED ORDER — DEXCOM G7 SENSOR MISC
3 refills | Status: DC
Start: 2023-07-24 — End: 2024-07-19

## 2023-07-29 ENCOUNTER — Ambulatory Visit (INDEPENDENT_AMBULATORY_CARE_PROVIDER_SITE_OTHER): Payer: Medicare Other | Admitting: Dermatology

## 2023-07-29 DIAGNOSIS — W57XXXA Bitten or stung by nonvenomous insect and other nonvenomous arthropods, initial encounter: Secondary | ICD-10-CM | POA: Diagnosis not present

## 2023-07-29 DIAGNOSIS — S90861A Insect bite (nonvenomous), right foot, initial encounter: Secondary | ICD-10-CM | POA: Diagnosis not present

## 2023-07-29 DIAGNOSIS — Z7189 Other specified counseling: Secondary | ICD-10-CM

## 2023-07-29 DIAGNOSIS — R21 Rash and other nonspecific skin eruption: Secondary | ICD-10-CM | POA: Diagnosis not present

## 2023-07-29 DIAGNOSIS — L920 Granuloma annulare: Secondary | ICD-10-CM

## 2023-07-29 DIAGNOSIS — S90862A Insect bite (nonvenomous), left foot, initial encounter: Secondary | ICD-10-CM

## 2023-07-29 DIAGNOSIS — Z79899 Other long term (current) drug therapy: Secondary | ICD-10-CM

## 2023-07-29 MED ORDER — MOMETASONE FUROATE 0.1 % EX CREA: TOPICAL_CREAM | CUTANEOUS | 0 refills | Status: AC

## 2023-07-29 NOTE — Patient Instructions (Addendum)
Recommend Insect Shield clothing when outside.   [employer], payments, invoices, etc. [address] [place], [place] [address]  Phone: [phone] Fax: [phone]  Due to recent changes in healthcare laws, you may see results of your pathology and/or laboratory studies on MyChart before the doctors have had a chance to review them. We understand that in some cases there may be results that are confusing or concerning to you. Please understand that not all results are received at the same time and often the doctors may need to interpret multiple results in order to provide you with the best plan of care or course of treatment. Therefore, we ask that you please give [place] 2 business days to thoroughly review all your results before contacting the office for clarification. Should we see a critical lab result, you will be contacted sooner.   If You Need Anything After Your Visit  If you have any questions or concerns for your doctor, please call our main line at [phone] and press option 4 to reach your doctor's medical assistant. If no one answers, please leave a voicemail as directed and we will return your call as soon as possible. Messages left after 4 pm will be answered the following business day.   You may also send [place] a message via MyChart. We typically respond to MyChart messages within 1-2 business days.  For prescription refills, please ask your pharmacy to contact our office. Our fax number is [phone].  If you have an urgent issue when the clinic is closed that cannot wait until the next business day, you can page your doctor at the number below.    Please note that while we do our best to be available for urgent issues outside of office hours, we are not available 24/7.   If you have an urgent issue and are unable to reach [place], you may choose to seek medical care at your doctor's office, retail clinic, urgent care center, or emergency room.  If you have  a medical emergency, please immediately call 911 or go to the emergency department.  Pager Numbers  - Dr. Gwen Pounds: [phone]  - Dr. Roseanne Reno: [phone]  - Dr. Katrinka Blazing: [phone]   In the event of inclement weather, please call our main line at [phone] for an update on the status of any delays or closures.  Dermatology Medication Tips: Please keep the boxes that topical medications come in in order to help keep track of the instructions about where and how to use these. Pharmacies typically print the medication instructions only on the boxes and not directly on the medication tubes.   If your medication is too expensive, please contact our office at [phone] option 4 or send [place] a message through MyChart.   We are unable to tell what your co-pay for medications will be in advance as this is different depending on your insurance coverage. However, we may be able to find a substitute medication at lower cost or fill out paperwork to get insurance to cover a needed medication.   If a prior authorization is required to get your medication covered by your insurance company, please allow [place] 1-2 business days to complete this process.  Drug prices often vary depending on where the prescription is filled and some pharmacies may offer cheaper prices.  The website www.goodrx.com contains coupons for medications through different pharmacies. The prices here do not account for what the cost may be with help from insurance (it may be cheaper  with your insurance), but the website can give you the price if you did not use any insurance.  - You can print the associated coupon and take it with your prescription to the pharmacy.  - You may also stop by our office during regular business hours and pick up a GoodRx coupon card.  - If you need your prescription sent electronically to a different pharmacy, notify our office through [facility] or by phone at [phone] option  4.     Si Usted Necesita Algo Despus de Su Visita  Tambin puede enviarnos un mensaje a travs de Clinical cytogeneticist. Por lo general respondemos a los mensajes de MyChart en el transcurso de 1 a 2 das hbiles.  Para renovar recetas, por favor pida a su farmacia que se ponga en contacto con nuestra oficina. Annie Sable de fax es [place] [phone].  Si tiene un asunto urgente cuando la clnica est cerrada y que no puede esperar hasta el siguiente da hbil, puede llamar/localizar a su doctor(a) al nmero que aparece a continuacin.   Por favor, tenga en cuenta que aunque hacemos todo lo posible para estar disponibles para asuntos urgentes fuera del horario de [place], no estamos disponibles las 24 horas del da, los 7 [address]  [address] de la [place].   Si tiene un problema urgente y no puede comunicarse con nosotros, puede optar por buscar atencin mdica  en el consultorio de su doctor(a), en una clnica privada, en un centro de atencin urgente o en una sala de emergencias.  Si tiene Engineer, drilling, por favor llame inmediatamente al 911 o vaya a la sala de emergencias.  Nmeros de bper  - Dr. Gwen Pounds: [phone]  - Dra. Roseanne Reno: [phone]  - Dr. Katrinka Blazing: [phone]   En caso de inclemencias del tiempo, por favor llame a Lacy Duverney principal al [phone] para una actualizacin sobre el [place] de cualquier retraso o cierre.  Consejos para la medicacin en dermatologa: Por favor, guarde las cajas en las que vienen los medicamentos de uso tpico para ayudarle a seguir las instrucciones sobre dnde y cmo usarlos. Las farmacias generalmente imprimen las instrucciones del medicamento slo en las cajas y no directamente en los tubos del [place].   Si su medicamento es muy caro, por favor, pngase en contacto con Rolm Gala llamando al [phone] y presione la opcin 4 o envenos un mensaje a travs de Clinical cytogeneticist.   No podemos decirle cul ser su copago por los medicamentos por  adelantado ya que esto es diferente dependiendo de la cobertura de su seguro. Sin embargo, es posible que podamos encontrar un medicamento sustituto a Audiological scientist un formulario para que el seguro cubra el medicamento que se considera necesario.   Si se requiere una autorizacin previa para que su compaa de seguros [place] su medicamento, por favor permtanos de 1 a 2 das hbiles para completar [address]  Los precios de los medicamentos varan con frecuencia dependiendo del Environmental consultant de dnde se surte la receta y alguna farmacias pueden ofrecer precios ms baratos.  El sitio web www.goodrx.com tiene cupones para medicamentos de Health and safety inspector. Los precios aqu no tienen en cuenta lo que podra costar con la ayuda del seguro (puede ser ms barato con su seguro), pero el sitio web puede darle el precio si no utiliz Tourist information centre manager.  - Puede imprimir el cupn correspondiente y llevarlo con su receta a la farmacia.  - Tambin puede pasar por nuestra oficina durante el horario de atencin regular y Education officer, museum  una tarjeta de cupones de GoodRx.  - Si necesita que su receta se enve electrnicamente a una farmacia diferente, informe a nuestra oficina a travs de MyChart de [place] o por telfono llamando al [phone] y presione la opcin 4.

## 2023-07-29 NOTE — Progress Notes (Unsigned)
   Follow-Up Visit   Subjective  Holly Steele is a 67 y.o. female who presents for the following: Pt was bitten on the feet by mosquitos on 05/13/23 - patient has experienced erythema and itching since then which hasn't resolved.   The patient has spots, moles and lesions to be evaluated, some may be new or changing and the patient may have concern these could be cancer.   The following portions of the chart were reviewed this encounter and updated as appropriate: medications, allergies, medical history  Review of Systems:  No other skin or systemic complaints except as noted in HPI or Assessment and Plan.  Objective  Well appearing patient in no apparent distress; mood and affect are within normal limits.   A focused examination was performed of the following areas: The feet    Relevant exam findings are noted in the Assessment and Plan.    Assessment & Plan   Insect bite reaction on the feet vs granuloma annulare Exam: Pink patches on the dorsum feet.  Treatment Plan: Start Mometasone 0.1% cream QD-BID x 2 weeks. Then decrease use to 5d/wk for two more weeks PRN.  Recommend insect shield clothing when camping or spending time outside.   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 Then decrease use to 5d/wk for two more weeks PRN.  Consider bx if not improving with Mometasone 0.1% cream.   Return for appointment as scheduled.  Maylene Roes, CMA, am acting as scribe for Armida Sans, MD .   Documentation: I have reviewed the above documentation for accuracy and completeness, and I agree with the above.  Armida Sans, MD

## 2023-07-30 ENCOUNTER — Encounter: Payer: Self-pay | Admitting: Dermatology

## 2023-09-08 ENCOUNTER — Other Ambulatory Visit: Payer: Medicare Other

## 2023-09-16 ENCOUNTER — Encounter: Payer: Medicare Other | Admitting: Family Medicine

## 2023-10-12 ENCOUNTER — Other Ambulatory Visit: Payer: Self-pay | Admitting: Family Medicine

## 2023-10-12 DIAGNOSIS — E7801 Familial hypercholesterolemia: Secondary | ICD-10-CM

## 2023-10-13 ENCOUNTER — Other Ambulatory Visit: Payer: Medicare Other

## 2023-10-14 NOTE — Telephone Encounter (Signed)
Requested Prescriptions  Pending Prescriptions Disp Refills   Evolocumab (REPATHA) 140 MG/ML SOSY [Pharmacy Med Name: REPATHA 140 MG/ML SYRINGE] 6 mL 3    Sig: INJECT 140 MG INTO THE SKIN EVERY 14 (FOURTEEN) DAYS.     Cardiovascular: PCSK9 Inhibitors Passed - 10/12/2023  8:48 AM      Passed - Valid encounter within last 12 months    Recent Outpatient Visits           6 months ago Welcome to [employer] preventive visit   [place] [facility] [place], Netta Neat, DO   9 months ago Mild intermittent asthma with exacerbation   [place] [facility] [place], Salvadore Oxford, NP   1 year ago Annual physical exam   [place] [facility] Smitty Cords, DO   1 year ago Controlled type 2 diabetes mellitus with diabetic nephropathy, without long-term current use of insulin [facility])   [place] [facility] Smitty Cords, DO   1 year ago Primary insomnia   [place] [facility] Smitty Cords, DO       Future Appointments             In 1 month Althea Charon, Netta Neat, DO [place] [facility], [place]   In 1 month Deirdre Evener, MD [place] [facility]            Passed - Lipid Panel completed within the last 12 months    Cholesterol  Date Value Ref Range Status  04/17/2023 152 <200 mg/dL Final   LDL Cholesterol (Calc)  Date Value Ref Range Status  04/17/2023 76 mg/dL (calc) Final    Comment:    Reference range: <100 . Desirable range <100 mg/dL for primary prevention;   <70 mg/dL for patients with CHD or diabetic patients  with > or = 2 CHD risk factors. [name] LDL-C is now calculated using the Martin-Hopkins  calculation, which is a validated novel method providing  better accuracy than the Friedewald equation in the  estimation of LDL-C.  Horald Pollen et al. Lenox Ahr. 8756;433(29): 2061-2068   (http://education.[facility].com/faq/FAQ164)    HDL  Date Value Ref Range Status  04/17/2023 50 > OR = 50 mg/dL Final   Triglycerides  Date Value Ref Range Status  04/17/2023 154 (H) <150 mg/dL Final

## 2023-11-14 ENCOUNTER — Other Ambulatory Visit: Payer: Self-pay

## 2023-11-14 DIAGNOSIS — Z0000 Encounter for general adult medical examination without abnormal findings: Secondary | ICD-10-CM

## 2023-11-14 DIAGNOSIS — E559 Vitamin D deficiency, unspecified: Secondary | ICD-10-CM

## 2023-11-14 DIAGNOSIS — E1121 Type 2 diabetes mellitus with diabetic nephropathy: Secondary | ICD-10-CM

## 2023-11-14 DIAGNOSIS — E1169 Type 2 diabetes mellitus with other specified complication: Secondary | ICD-10-CM

## 2023-11-14 DIAGNOSIS — E538 Deficiency of other specified B group vitamins: Secondary | ICD-10-CM

## 2023-11-17 ENCOUNTER — Other Ambulatory Visit: Payer: Medicare Other

## 2023-11-19 ENCOUNTER — Other Ambulatory Visit: Payer: Self-pay | Admitting: Obstetrics and Gynecology

## 2023-11-19 DIAGNOSIS — N952 Postmenopausal atrophic vaginitis: Secondary | ICD-10-CM

## 2023-11-21 LAB — COMPREHENSIVE METABOLIC PANEL
AG Ratio: 1.7 (calc) (ref 1.0–2.5)
ALT: 12 U/L (ref 6–29)
AST: 15 U/L (ref 10–35)
Albumin: 4.6 g/dL (ref 3.6–5.1)
Alkaline phosphatase (APISO): 66 U/L (ref 37–153)
BUN/Creatinine Ratio: 21 (calc) (ref 6–22)
BUN: 22 mg/dL (ref 7–25)
CO2: 31 mmol/L (ref 20–32)
Calcium: 10.2 mg/dL (ref 8.6–10.4)
Chloride: 98 mmol/L (ref 98–110)
Creat: 1.06 mg/dL — ABNORMAL HIGH (ref 0.50–1.05)
Globulin: 2.7 g/dL (ref 1.9–3.7)
Glucose, Bld: 155 mg/dL — ABNORMAL HIGH (ref 65–99)
Potassium: 4.3 mmol/L (ref 3.5–5.3)
Sodium: 137 mmol/L (ref 135–146)
Total Bilirubin: 0.7 mg/dL (ref 0.2–1.2)
Total Protein: 7.3 g/dL (ref 6.1–8.1)

## 2023-11-21 LAB — HEMOGLOBIN A1C
Hgb A1c MFr Bld: 7.2 %{Hb} — ABNORMAL HIGH (ref <5.7)
Mean Plasma Glucose: 160 mg/dL
eAG (mmol/L): 8.9 mmol/L

## 2023-11-21 LAB — VITAMIN D 1,25 DIHYDROXY
Vitamin D 1, 25 (OH)2 Total: 37 pg/mL (ref 18–72)
Vitamin D2 1, 25 (OH)2: <8 pg/mL
Vitamin D3 1, 25 (OH)2: 37 pg/mL

## 2023-11-21 LAB — LIPID PANEL
Cholesterol: 145 mg/dL (ref <200)
HDL: 51 mg/dL (ref >=50)
LDL Cholesterol (Calc): 70 mg/dL
Non-HDL Cholesterol (Calc): 94 mg/dL (ref <130)
Total CHOL/HDL Ratio: 2.8 (calc) (ref <5.0)
Triglycerides: 153 mg/dL — ABNORMAL HIGH (ref <150)

## 2023-11-21 LAB — VITAMIN B12: Vitamin B-12: 527 pg/mL (ref 200–1100)

## 2023-11-26 ENCOUNTER — Ambulatory Visit (INDEPENDENT_AMBULATORY_CARE_PROVIDER_SITE_OTHER): Payer: Medicare Other | Admitting: Family Medicine

## 2023-11-26 ENCOUNTER — Encounter: Payer: Self-pay | Admitting: Family Medicine

## 2023-11-26 VITALS — BP 110/60 | HR 79 | Ht 65.0 in | Wt 131.0 lb

## 2023-11-26 DIAGNOSIS — F5101 Primary insomnia: Secondary | ICD-10-CM

## 2023-11-26 DIAGNOSIS — G720 Drug-induced myopathy: Secondary | ICD-10-CM

## 2023-11-26 DIAGNOSIS — E7801 Familial hypercholesterolemia: Secondary | ICD-10-CM | POA: Diagnosis not present

## 2023-11-26 DIAGNOSIS — E1121 Type 2 diabetes mellitus with diabetic nephropathy: Secondary | ICD-10-CM

## 2023-11-26 DIAGNOSIS — N1831 Chronic kidney disease, stage 3a: Secondary | ICD-10-CM

## 2023-11-26 DIAGNOSIS — M816 Localized osteoporosis [Lequesne]: Secondary | ICD-10-CM

## 2023-11-26 DIAGNOSIS — J4521 Mild intermittent asthma with (acute) exacerbation: Secondary | ICD-10-CM

## 2023-11-26 DIAGNOSIS — Z1231 Encounter for screening mammogram for malignant neoplasm of breast: Secondary | ICD-10-CM

## 2023-11-26 MED ORDER — ALBUTEROL SULFATE HFA 108 (90 BASE) MCG/ACT IN AERS: 2.0000 | INHALATION_SPRAY | RESPIRATORY_TRACT | 2 refills | Status: AC | PRN

## 2023-11-26 MED ORDER — ESCITALOPRAM OXALATE 10 MG PO TABS: 10.0000 mg | ORAL_TABLET | Freq: Every day | ORAL | 3 refills | Status: DC

## 2023-11-26 MED ORDER — ACCU-CHEK GUIDE TEST VI STRP: ORAL_STRIP | 1 refills | Status: AC

## 2023-11-26 MED ORDER — OZEMPIC (1 MG/DOSE) 4 MG/3ML ~~LOC~~ SOPN: 1.0000 mg | PEN_INJECTOR | SUBCUTANEOUS | 3 refills | Status: DC

## 2023-11-26 MED ORDER — ALENDRONATE SODIUM 70 MG PO TABS: 70.0000 mg | ORAL_TABLET | ORAL | 3 refills | Status: DC

## 2023-11-26 NOTE — Patient Instructions (Addendum)
Thank you for coming to the office today.  Recent Labs    04/17/23 0937 11/17/23 1037  HGBA1C 6.9* 7.2*   No RSV Vaccine  Optional COVID Vaccines yearly.  Next Cologuard 2027  For Mammogram screening for breast cancer   ALSO DEXA Bone Density Scan  Call the Imaging Center below anytime to schedule your own appointment now that order has been placed.  [facility] at [facility] [address] [place], [place] [address] Phone: [phone]   -----------------------  You have been referred for a Coronary Calcium Score Cardiac CT Scan. This is a screening test for patients aged 88-50+ with cardiovascular risk factors or who are healthy but would be interested in Cardiovascular Screening for heart disease. Even if there is a family history of heart disease, this imaging can be useful. Typically it can be done every 5+ years or at a different timeline we agree on  The scan will look at the chest and mainly focus on the heart and identify early signs of calcium build up or blockages within the heart arteries. It is not 100% accurate for identifying blockages or heart disease, but it is useful to help [place] predict who may have some early changes or be at risk in the future for a heart attack or cardiovascular problem.  The results are reviewed by a Cardiologist and they will document the results. It should become available on MyChart. Typically the results are divided into percentiles based on other patients of the same demographic and age. So it will compare your risk to others similar to you. If you have a higher score >99 or higher percentile >75%tile, it is recommended to consider Statin cholesterol therapy and or referral to Cardiologist. I will try to help explain your results and if we have questions we can contact the Cardiologist.  You will be contacted for scheduling. Usually it is done at any imaging facility  through [facility] or [facility].  The cost is $99 flat fee total and it does not go through insurance, so no authorization is required.   Please schedule a Follow-up Appointment to: Return for 6 month DM A1c.  If you have any other questions or concerns, please feel free to call the office or send a message through MyChart. You may also schedule an earlier appointment if necessary.  Additionally, you may be receiving a survey about your experience at our office within a few days to 1 week by e-mail or mail. We value your feedback.  Saralyn Pilar, DO [facility], [place]

## 2023-11-26 NOTE — Progress Notes (Signed)
Subjective:    Patient ID: Holly Steele, female    DOB: [birth year], 68 y.o.   MRN: [id number]  Holly Steele is a 68 y.o. female presenting on 11/26/2023 for Diabetes   HPI  Discussed the use of AI scribe software for clinical note transcription with the patient, who gave verbal consent to proceed.  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 CBGs - continuous glucose monitor, DexCom6 - refill 9 sensors (1 box 3 sensor x 3 = 90 day), 1 transmitter - previously covered by insurance Still uses rare Accu-chek guide CBG readings to confirm at times Meds: Ozempic 1mg  weekly inj, Jardiance 25mg  daily Reports good compliance. Tolerating well w/o side-effects Last DM Eye Clydene Pugh 07/28/23 will request copy of record. Lifestyle: - Diet Low Carb Diet, see above - Now going to gym more and walking Denies hypoglycemia, polyuria, visual changes, numbness or tingling.   Osteoporosis On prior DEXA, last done 2022 On Fosamax since 2021, will anticipate re-eval after next DEXA if indicated to continue or would pause  Insomnia Anxiety Continue Escitalopram 10mg  daily    Vitamin B12 Deficiency Lab result 527, slightly down from previous 731. She is taking B12 every other day.  Vitamin D Deficiency Improved at Vit D 37   Health Maintenance  Updated Flu and COVID 06/2023  Shingrix updated, Prevnar 20 updated 04/2023  Cologuard 05/27/23 negative, repeat 3 years, 2027  Mammogram due 2025     04/08/2023    2:16 PM 04/08/2023    2:15 PM 09/12/2022    9:23 AM  Depression screen PHQ 2/9  Decreased Interest 0 0 0  Down, Depressed, Hopeless 0 0 0  PHQ - 2 Score 0 0 0  Altered sleeping 1  2  Tired, decreased energy 1  0  Change in appetite 0  0  Feeling bad or failure about yourself  0  0  Trouble concentrating 0  0  Moving  slowly or fidgety/restless 0  0  Suicidal thoughts 0  0  PHQ-9 Score 2  2  Difficult doing work/chores Not difficult at all  Not difficult at all       04/08/2023    2:16 PM 09/12/2022    9:23 AM 03/06/2022   10:09 AM 10/23/2021    1:40 PM  GAD 7 : Generalized Anxiety Score  Nervous, Anxious, on Edge 1 1 1  0  Control/stop worrying 1 0 0 0  Worry too much - different things 0 0 0 0  Trouble relaxing 0 0 0 0  Restless 0 0 0 0  Easily annoyed or irritable 0 0 0 0  Afraid - awful might happen 0 0 0 0  Total GAD 7 Score 2 1 1  0  Anxiety Difficulty Not difficult at all Not difficult at all Not difficult at all Not difficult at all     Past Medical History:  Diagnosis Date   Anxiety    Asthma    past   Dysplastic nevus 08/14/2020   L lat buttocks. Mild atypia, close to margin.   Osteoporosis    Past Surgical History:  Procedure Laterality Date   POLYPECTOMY     Social History   Socioeconomic History   Marital status: Married    Spouse name: Not on file   Number of children: Not on file  Years of education: Not on file   Highest education level: Not on file  Occupational History   Occupation: Retired  Tobacco Use   Smoking status: Never   Smokeless tobacco: Never  Vaping Use   Vaping status: Never Used  Substance and Sexual Activity   Alcohol use: Yes    Comment: Social    Drug use: Never   Sexual activity: Not Currently    Birth control/protection: Post-menopausal  Other Topics Concern   Not on file  Social History Narrative   Not on file   Social Drivers of Health   Financial Resource Strain: Low Risk  (04/08/2023)   Overall Financial Resource Strain (CARDIA)    Difficulty of Paying Living Expenses: Not hard at all  Food Insecurity: No Food Insecurity (04/08/2023)   Hunger Vital Sign    Worried About Running Out of Food in the Last Year: Never true    Ran Out of Food in the Last Year: Never true  Transportation Needs: No Transportation Needs (04/08/2023)    PRAPARE - Administrator, Civil Service (Medical): No    Lack of Transportation (Non-Medical): No  Physical Activity: Sufficiently Active (04/08/2023)   Exercise Vital Sign    Days of Exercise per Week: 5 days    Minutes of Exercise per Session: 80 min  Stress: No Stress Concern Present (04/08/2023)   [employer] of Occupational Health - Occupational Stress Questionnaire    Feeling of Stress : Only a little  Social Connections: Moderately Isolated (04/08/2023)   Social Connection and Isolation Panel [NHANES]    Frequency of Communication with Friends and Family: More than three times a week    Frequency of Social Gatherings with Friends and Family: Once a week    Attends Religious Services: Never    Database administrator or Organizations: No    Attends Banker Meetings: Never    Marital Status: Married  Catering manager Violence: Not At Risk (04/08/2023)   Humiliation, Afraid, Rape, and Kick questionnaire    Fear of Current or Ex-Partner: No    Emotionally Abused: No    Physically Abused: No    Sexually Abused: No   Family History  Problem Relation Age of Onset   Hypertension Mother    Alcohol abuse Father    Diabetes Father    Diabetes Sister    Diabetes Brother    Current Outpatient Medications on File Prior to Visit  Medication Sig   calcium-vitamin D (OSCAL WITH D) 500-200 MG-UNIT tablet Take 1 tablet by mouth.   Continuous Blood Gluc Receiver (DEXCOM G6 RECEIVER) DEVI    Continuous Glucose Sensor (DEXCOM G7 SENSOR) MISC USE SENSOR TO CHECK BLOOD SUGAR AS DIRECTED. REPLACE SENSOR EVERY 10 DAYS   Continuous Glucose Transmitter (DEXCOM G6 TRANSMITTER) MISC USE TO CHECK BLOOD SUGAR ASDIRECTED   estradiol (ESTRACE) 0.1 MG/GM vaginal cream PLACE 1 APPLICATORFUL VAGINALLY AT BEDTIME.   Evolocumab (REPATHA) 140 MG/ML SOSY INJECT 140 MG INTO THE SKIN EVERY 14 (FOURTEEN) DAYS.   JARDIANCE 10 MG TABS tablet Take 1 tablet (10 mg total) by mouth daily before  breakfast.   magnesium oxide (MAG-OX) 400 MG tablet Take 400 mg by mouth daily.   mometasone (ELOCON) 0.1 % cream Apply to rash QD-BID PRN up to two weeks. Then decrease use to QD-BID PRN up to 5d/wk.   vitamin B-12 (CYANOCOBALAMIN) 1000 MCG tablet Take 1,000 mcg by mouth every other day.   No current facility-administered medications on file  prior to visit.    Review of Systems Per HPI unless specifically indicated above     Objective:    BP 110/60   Pulse 79   Ht 5\' 5"  (1.651 m)   Wt 131 lb (59.4 kg)   SpO2 99%   BMI 21.80 kg/m   Wt Readings from Last 3 Encounters:  11/26/23 131 lb (59.4 kg)  04/08/23 132 lb (59.9 kg)  01/06/23 125 lb (56.7 kg)    Physical Exam Vitals and nursing note reviewed.  Constitutional:      General: She is not in acute distress.    Appearance: She is well-developed. She is not diaphoretic.     Comments: Well-appearing, comfortable, cooperative  HENT:     Head: Normocephalic and atraumatic.  Eyes:     General:        Right eye: No discharge.        Left eye: No discharge.     Conjunctiva/sclera: Conjunctivae normal.     Pupils: Pupils are equal, round, and reactive to light.  Neck:     Thyroid: No thyromegaly.     Vascular: No carotid bruit.  Cardiovascular:     Rate and Rhythm: Normal rate and regular rhythm.     Pulses: Normal pulses.     Heart sounds: Normal heart sounds. No murmur heard. Pulmonary:     Effort: Pulmonary effort is normal. No respiratory distress.     Breath sounds: Normal breath sounds. No wheezing or rales.  Abdominal:     General: Bowel sounds are normal. There is no distension.     Palpations: Abdomen is soft. There is no mass.     Tenderness: There is no abdominal tenderness.  Musculoskeletal:        General: No tenderness. Normal range of motion.     Cervical back: Normal range of motion and neck supple.     Right lower leg: No edema.     Left lower leg: No edema.     Comments: Upper / Lower Extremities: -  Normal muscle tone, strength bilateral upper extremities 5/5, lower extremities 5/5  Lymphadenopathy:     Cervical: No cervical adenopathy.  Skin:    General: Skin is warm and dry.     Findings: No erythema or rash.  Neurological:     Mental Status: She is alert and oriented to person, place, and time.     Comments: Distal sensation intact to light touch all extremities  Psychiatric:        Mood and Affect: Mood normal.        Behavior: Behavior normal.        Thought Content: Thought content normal.     Comments: Well groomed, good eye contact, normal speech and thoughts     Diabetic Foot Exam - Simple   Simple Foot Form Diabetic Foot exam was performed with the following findings: Yes 11/26/2023  2:02 PM  Visual Inspection See comments: Yes Sensation Testing Intact to touch and monofilament testing bilaterally: Yes Pulse Check Posterior Tibialis and Dorsalis pulse intact bilaterally: Yes Comments Bilateral bunion, no other deformity. Smooth without dry skin or callus. No ulceration. Intact monofilament.      Results for orders placed or performed in visit on 11/14/23  Comprehensive Metabolic Panel (CMET)   Collection Time: 11/17/23 10:37 AM  Result Value Ref Range   Glucose, Bld 155 (H) 65 - 99 mg/dL   BUN 22 7 - 25 mg/dL   Creat 8.11 (H) 9.14 -  1.05 mg/dL   BUN/Creatinine Ratio 21 6 - 22 (calc)   Sodium 137 135 - 146 mmol/L   Potassium 4.3 3.5 - 5.3 mmol/L   Chloride 98 98 - 110 mmol/L   CO2 31 20 - 32 mmol/L   Calcium 10.2 8.6 - 10.4 mg/dL   Total Protein 7.3 6.1 - 8.1 g/dL   Albumin 4.6 3.6 - 5.1 g/dL   Globulin 2.7 1.9 - 3.7 g/dL (calc)   AG Ratio 1.7 1.0 - 2.5 (calc)   Total Bilirubin 0.7 0.2 - 1.2 mg/dL   Alkaline phosphatase (APISO) 66 37 - 153 U/L   AST 15 10 - 35 U/L   ALT 12 6 - 29 U/L  Lipid panel   Collection Time: 11/17/23 10:37 AM  Result Value Ref Range   Cholesterol 145 <200 mg/dL   HDL 51 > OR = 50 mg/dL   Triglycerides 161 (H) <150 mg/dL    LDL Cholesterol (Calc) 70 mg/dL (calc)   Total CHOL/HDL Ratio 2.8 <5.0 (calc)   Non-HDL Cholesterol (Calc) 94 <096 mg/dL (calc)  HgB E4V   Collection Time: 11/17/23 10:37 AM  Result Value Ref Range   Hgb A1c MFr Bld 7.2 (H) <5.7 % of total Hgb   Mean Plasma Glucose 160 mg/dL   eAG (mmol/L) 8.9 mmol/L  Vitamin D 1,25 dihydroxy   Collection Time: 11/17/23 10:37 AM  Result Value Ref Range   Vitamin D 1, 25 (OH)2 Total 37 18 - 72 pg/mL   Vitamin D3 1, 25 (OH)2 37 pg/mL   Vitamin D2 1, 25 (OH)2 <8 pg/mL  Vitamin B12   Collection Time: 11/17/23 10:37 AM  Result Value Ref Range   Vitamin B-12 527 200 - 1,100 pg/mL      Assessment & Plan:   Problem List Items Addressed This Visit     CKD (chronic kidney disease), stage III (HCC)   Relevant Orders   Urine Microalbumin w/creat. ratio   Controlled type 2 diabetes mellitus with diabetic nephropathy (HCC) - Primary   Relevant Medications   OZEMPIC, 1 MG/DOSE, 4 MG/3ML SOPN   ACCU-CHEK GUIDE TEST test strip   Other Relevant Orders   Urine Microalbumin w/creat. ratio   Diabetic nephropathy associated with type 2 diabetes mellitus (HCC)   Relevant Medications   OZEMPIC, 1 MG/DOSE, 4 MG/3ML SOPN   Drug-induced myopathy   Familial hypercholesterolemia   Relevant Orders   CT CARDIAC SCORING (SELF PAY ONLY)   Primary insomnia   Relevant Medications   escitalopram (LEXAPRO) 10 MG tablet   Other Visit Diagnoses       Encounter for screening mammogram for malignant neoplasm of breast       Relevant Orders   MM 3D SCREENING MAMMOGRAM BILATERAL BREAST     Localized osteoporosis without current pathological fracture       Relevant Medications   alendronate (FOSAMAX) 70 MG tablet   Other Relevant Orders   DG Bone Density     Mild intermittent asthma with exacerbation       Relevant Medications   albuterol (VENTOLIN HFA) 108 (90 Base) MCG/ACT inhaler        Updated Health Maintenance information Reviewed recent lab results with  patient Encouraged improvement to lifestyle with diet and exercise Goal of weight loss   Diabetes Mellitus Type 2 A1c increased to 7.2 from previous 6.5-6.9 range. Discussed the importance of lifestyle modifications to reverse the trend. No changes in medication at this time. -Continue current medications Ozempic 1mg  weekly,  and Jardiance 10mg  daily -Check blood sugar up to once daily as needed. -Encouraged lifestyle modifications to improve glycemic control. DM Foot exam Request DM Eye exam copy  Familial Hyperlipidemia LDL at goal (70). Patient is on Repatha. -Continue current medication regimen.  Vaccination Status Up to date on flu, COVID, shingles, and pneumonia vaccines. Discussed potential future RSV vaccine when patient meets age criteria. -Continue current vaccination schedule.  Cancer Screening Colon screening done in July 2024, next due in 2027 Mammogram done in August 2023, next due in 2025.  Bone Health Last DEXA scan in 2022. Patient is on Alendronate. -Continue current medication. -Plan for next DEXA scan in 2025 at [facility].  Cardiovascular Risk Discussed potential for CT scan for risk stratification given patient's hyperlipidemia. -Order CT scan for risk stratification.         Orders Placed This Encounter  Procedures   MM 3D SCREENING MAMMOGRAM BILATERAL BREAST    Standing Status:   Future    Expiration Date:   11/25/2024    Reason for Exam (SYMPTOM  OR DIAGNOSIS REQUIRED):   Screening bilateral 3D Mammogram Tomo    Preferred imaging location?:   [facility]   DG Bone Density    Standing Status:   Future    Expiration Date:   11/25/2024    Reason for Exam (SYMPTOM  OR DIAGNOSIS REQUIRED):   osteoporosis    Preferred imaging location?:   [facility]   CT CARDIAC SCORING (SELF PAY ONLY)    Standing Status:   Future    Expiration Date:   11/25/2024    Preferred imaging location?:   [facility]   Urine Microalbumin  w/creat. ratio    Meds ordered this encounter  Medications   OZEMPIC, 1 MG/DOSE, 4 MG/3ML SOPN    Sig: Inject 1 mg into the skin once a week.    Dispense:  9 mL    Refill:  3   ACCU-CHEK GUIDE TEST test strip    Sig: Use to check blood sugar up to 1 x daily if needed.    Dispense:  100 each    Refill:  1    E11.21 Accu-chek guide   escitalopram (LEXAPRO) 10 MG tablet    Sig: Take 1 tablet (10 mg total) by mouth daily.    Dispense:  90 tablet    Refill:  3   alendronate (FOSAMAX) 70 MG tablet    Sig: Take 1 tablet (70 mg total) by mouth once a week.    Dispense:  12 tablet    Refill:  3   albuterol (VENTOLIN HFA) 108 (90 Base) MCG/ACT inhaler    Sig: Inhale 2 puffs into the lungs every 4 (four) hours as needed for wheezing or shortness of breath (cough).    Dispense:  6.7 g    Refill:  2     Follow up plan: Return for 6 month DM A1c.  Saralyn Pilar, DO [facility] 11/26/2023, 1:35 PM

## 2023-11-27 ENCOUNTER — Ambulatory Visit: Payer: Medicare Other | Admitting: Dermatology

## 2023-11-27 LAB — MICROALBUMIN / CREATININE URINE RATIO
Creatinine, Urine: 128 mg/dL (ref 20–275)
Microalb Creat Ratio: 2 mg/g{creat} (ref <30)
Microalb, Ur: 0.2 mg/dL

## 2023-12-03 ENCOUNTER — Ambulatory Visit (INDEPENDENT_AMBULATORY_CARE_PROVIDER_SITE_OTHER): Payer: Medicare Other | Admitting: Dermatology

## 2023-12-03 ENCOUNTER — Encounter: Payer: Self-pay | Admitting: Dermatology

## 2023-12-03 DIAGNOSIS — L578 Other skin changes due to chronic exposure to nonionizing radiation: Secondary | ICD-10-CM | POA: Diagnosis not present

## 2023-12-03 DIAGNOSIS — L821 Other seborrheic keratosis: Secondary | ICD-10-CM

## 2023-12-03 DIAGNOSIS — D1801 Hemangioma of skin and subcutaneous tissue: Secondary | ICD-10-CM | POA: Diagnosis not present

## 2023-12-03 DIAGNOSIS — L814 Other melanin hyperpigmentation: Secondary | ICD-10-CM | POA: Diagnosis not present

## 2023-12-03 DIAGNOSIS — Z1283 Encounter for screening for malignant neoplasm of skin: Secondary | ICD-10-CM | POA: Diagnosis not present

## 2023-12-03 DIAGNOSIS — L920 Granuloma annulare: Secondary | ICD-10-CM | POA: Insufficient documentation

## 2023-12-03 DIAGNOSIS — D229 Melanocytic nevi, unspecified: Secondary | ICD-10-CM

## 2023-12-03 MED ORDER — TRETINOIN MICROSPHERE 0.08 % EX GEL: CUTANEOUS | 6 refills | Status: AC

## 2023-12-03 NOTE — Patient Instructions (Addendum)
[facility] - [place], [place] - [address] Jacklynn [place] [address] Phone: [phone]  Fax: [phone]      Melanoma ABCDEs  Melanoma is the most dangerous type of skin cancer, and is the leading cause of death from skin disease.  You are more likely to develop melanoma if you: Have light-colored skin, light-colored eyes, or red or blond hair Spend a lot of time in the sun Tan regularly, either outdoors or in a tanning bed Have had blistering sunburns, especially during childhood Have a close family member who has had a melanoma Have atypical moles or large birthmarks  Early detection of melanoma is key since treatment is typically straightforward and cure rates are extremely high if we catch it early.   The first sign of melanoma is often a change in a mole or a new dark spot.  The ABCDE system is a way of remembering the signs of melanoma.  A for asymmetry:  The two halves do not match. B for border:  The edges of the growth are irregular. C for color:  A mixture of colors are present instead of an even brown color. D for diameter:  Melanomas are usually (but not always) greater than 6mm - the size of a pencil eraser. E for evolution:  The spot keeps changing in size, shape, and color.  Please check your skin once per month between visits. You can use a small mirror in front and a large mirror behind you to keep an eye on the back side or your body.   If you see any new or changing lesions before your next follow-up, please call to schedule a visit.  Please continue daily skin protection including broad spectrum sunscreen SPF 30+ to sun-exposed areas, reapplying every 2 hours as needed when you're outdoors.        Recommend daily broad spectrum sunscreen SPF 30+ to sun-exposed areas, reapply every 2 hours as needed. Call for new or changing lesions.  Staying in the shade or wearing long sleeves, sun glasses (UVA+UVB protection) and  wide brim hats (4-inch brim around the entire circumference of the hat) are also recommended for sun protection.       Due to recent changes in healthcare laws, you may see results of your pathology and/or laboratory studies on MyChart before the doctors have had a chance to review them. We understand that in some cases there may be results that are confusing or concerning to you. Please understand that not all results are received at the same time and often the doctors may need to interpret multiple results in order to provide you with the best plan of care or course of treatment. Therefore, we ask that you please give [place] 2 business days to thoroughly review all your results before contacting the office for clarification. Should we see a critical lab result, you will be contacted sooner.   If You Need Anything After Your Visit  If you have any questions or concerns for your doctor, please call our main line at [phone] and press option 4 to reach your doctor's medical assistant. If no one answers, please leave a voicemail as directed and we will return your call as soon as possible. Messages left after 4 pm will be answered the following business day.   You may also send [place] a message via MyChart. We typically respond to MyChart messages within 1-2 business days.  For prescription refills, please ask  your pharmacy to contact our office. Our fax number is [phone].  If you have an urgent issue when the clinic is closed that cannot wait until the next business day, you can page your doctor at the number below.    Please note that while we do our best to be available for urgent issues outside of office hours, we are not available 24/7.   If you have an urgent issue and are unable to reach [place], you may choose to seek medical care at your doctor's office, retail clinic, urgent care center, or emergency room.  If you have a medical emergency, please immediately call 911 or go to the emergency  department.  Pager Numbers  - Dr. Gwen Pounds: [phone]  - Dr. Roseanne Reno: [phone]  - Dr. Katrinka Blazing: [phone]   In the event of inclement weather, please call our main line at [phone] for an update on the status of any delays or closures.  Dermatology Medication Tips: Please keep the boxes that topical medications come in in order to help keep track of the instructions about where and how to use these. Pharmacies typically print the medication instructions only on the boxes and not directly on the medication tubes.   If your medication is too expensive, please contact our office at [phone] option 4 or send [place] a message through MyChart.   We are unable to tell what your co-pay for medications will be in advance as this is different depending on your insurance coverage. However, we may be able to find a substitute medication at lower cost or fill out paperwork to get insurance to cover a needed medication.   If a prior authorization is required to get your medication covered by your insurance company, please allow [place] 1-2 business days to complete this process.  Drug prices often vary depending on where the prescription is filled and some pharmacies may offer cheaper prices.  The website www.goodrx.com contains coupons for medications through different pharmacies. The prices here do not account for what the cost may be with help from insurance (it may be cheaper with your insurance), but the website can give you the price if you did not use any insurance.  - You can print the associated coupon and take it with your prescription to the pharmacy.  - You may also stop by our office during regular business hours and pick up a GoodRx coupon card.  - If you need your prescription sent electronically to a different pharmacy, notify our office through [facility] or by phone at [phone] option 4.     Si Usted Necesita Algo Despus de Su Visita  Tambin puede enviarnos  un mensaje a travs de Clinical cytogeneticist. Por lo general respondemos a los mensajes de MyChart en el transcurso de 1 a 2 das hbiles.  Para renovar recetas, por favor pida a su farmacia que se ponga en contacto con nuestra oficina. Annie Sable de fax es [place] [phone].  Si tiene un asunto urgente cuando la clnica est cerrada y que no puede esperar hasta el siguiente da hbil, puede llamar/localizar a su doctor(a) al nmero que aparece a continuacin.   Por favor, tenga en cuenta que aunque hacemos todo lo posible para estar disponibles para asuntos urgentes fuera del horario de [place], no estamos disponibles las 24 horas del da, los 7 [address]  [address] de la [place].   Si tiene un problema urgente y no puede comunicarse con nosotros, puede optar por buscar atencin Sports administrator de su  doctor(a), en una clnica privada, en un centro de atencin urgente o en una sala de emergencias.  Si tiene Engineer, drilling, por favor llame inmediatamente al 911 o vaya a la sala de emergencias.  Nmeros de bper  - Dr. Gwen Pounds: [phone]  - Dra. Roseanne Reno: [phone]  - Dr. Katrinka Blazing: [phone]   En caso de inclemencias del tiempo, por favor llame a Lacy Duverney principal al [phone] para una actualizacin sobre el [place] de cualquier retraso o cierre.  Consejos para la medicacin en dermatologa: Por favor, guarde las cajas en las que vienen los medicamentos de uso tpico para ayudarle a seguir las instrucciones sobre dnde y cmo usarlos. Las farmacias generalmente imprimen las instrucciones del medicamento slo en las cajas y no directamente en los tubos del [place].   Si su medicamento es muy caro, por favor, pngase en contacto con Rolm Gala llamando al [phone] y presione la opcin 4 o envenos un mensaje a travs de Clinical cytogeneticist.   No podemos decirle cul ser su copago por los medicamentos por adelantado ya que esto es diferente dependiendo de la cobertura de su seguro. Sin  embargo, es posible que podamos encontrar un medicamento sustituto a Audiological scientist un formulario para que el seguro cubra el medicamento que se considera necesario.   Si se requiere una autorizacin previa para que su compaa de seguros [place] su medicamento, por favor permtanos de 1 a 2 das hbiles para completar [address]  Los precios de los medicamentos varan con frecuencia dependiendo del Environmental consultant de dnde se surte la receta y alguna farmacias pueden ofrecer precios ms baratos.  El sitio web www.goodrx.com tiene cupones para medicamentos de Health and safety inspector. Los precios aqu no tienen en cuenta lo que podra costar con la ayuda del seguro (puede ser ms barato con su seguro), pero el sitio web puede darle el precio si no utiliz Tourist information centre manager.  - Puede imprimir el cupn correspondiente y llevarlo con su receta a la farmacia.  - Tambin puede pasar por nuestra oficina durante el horario de atencin regular y Education officer, museum una tarjeta de cupones de GoodRx.  - Si necesita que su receta se enve electrnicamente a una farmacia diferente, informe a nuestra oficina a travs de MyChart de [place] o por telfono llamando al [phone] y presione la opcin 4.

## 2023-12-03 NOTE — Progress Notes (Signed)
Follow-Up Visit   Subjective  Holly Steele is a 68 y.o. female who presents for the following: Skin Cancer Screening and Full Body Skin Exam, hx of Dysplastic nevus.  Patient requesting a refill of Retin A Micro .08% gel she uses for fine line and wrinkles on her face.   The patient presents for Total-Body Skin Exam (TBSE) for skin cancer screening and mole check. The patient has spots, moles and lesions to be evaluated, some may be new or changing and the patient may have concern these could be cancer.    The following portions of the chart were reviewed this encounter and updated as appropriate: medications, allergies, medical history  Review of Systems:  No other skin or systemic complaints except as noted in HPI or Assessment and Plan.  Objective  Well appearing patient in no apparent distress; mood and affect are within normal limits.  A full examination was performed including scalp, head, eyes, ears, nose, lips, neck, chest, axillae, abdomen, back, buttocks, bilateral upper extremities, bilateral lower extremities, hands, feet, fingers, toes, fingernails, and toenails. All findings within normal limits unless otherwise noted below.   Relevant physical exam findings are noted in the Assessment and Plan.    Assessment & Plan   SKIN CANCER SCREENING PERFORMED TODAY.  ACTINIC DAMAGE - Chronic condition, secondary to cumulative UV/sun exposure - diffuse scaly erythematous macules with underlying dyspigmentation - Recommend daily broad spectrum sunscreen SPF 30+ to sun-exposed areas, reapply every 2 hours as needed.  - Staying in the shade or wearing long sleeves, sun glasses (UVA+UVB protection) and wide brim hats (4-inch brim around the entire circumference of the hat) are also recommended for sun protection.  - Call for new or changing lesions.  LENTIGINES, SEBORRHEIC KERATOSES, HEMANGIOMAS - Benign normal skin lesions - Benign-appearing - Call for any  changes  FACIAL ELASTOSIS Exam: Rhytides and volume loss.  Treatment Plan: Continue Retin A 0.08% gel qhs   Recommend daily broad spectrum sunscreen SPF 30+ to sun-exposed areas, reapply every 2 hours as needed. Call for new or changing lesions.  Staying in the shade or wearing long sleeves, sun glasses (UVA+UVB protection) and wide brim hats (4-inch brim around the entire circumference of the hat) are also recommended for sun protection.     MELANOCYTIC NEVI - Tan-brown and/or pink-flesh-colored symmetric macules and papules - Benign appearing on exam today - Observation - Call clinic for new or changing moles - Recommend daily use of broad spectrum spf 30+ sunscreen to sun-exposed areas.   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 Violaceous patches on dorsal feet   Plan: Continue Mometasone cream BID prn for itching Contact us if worsening or symptomatic  HISTORY OF DYSPLASTIC NEVUS Left lateral buttock-mild atypia 08/14/2020 No evidence of recurrence today Recommend regular full body skin exams Recommend daily broad spectrum sunscreen SPF 30+ to sun-exposed areas, reapply every 2 hours as needed.  Call if any new or changing lesions are noted between office visits    MULTIPLE BENIGN NEVI   LENTIGINES   ACTINIC ELASTOSIS   CHERRY ANGIOMA   SEBORRHEIC KERATOSES   GRANULOMA ANNULARE   Return in about 1 year (around 12/02/2024) for TBSE, hx of Dysplastic nevus .  IAngelique Holm, CMA, am acting as scribe for Elie Goody, MD .   Documentation: I have reviewed the above documentation for accuracy and completeness, and I agree with the above.  Elie Goody, MD

## 2023-12-09 ENCOUNTER — Other Ambulatory Visit: Payer: Medicare Other

## 2023-12-10 ENCOUNTER — Ambulatory Visit: Payer: Medicare Other | Admitting: Obstetrics and Gynecology

## 2023-12-10 DIAGNOSIS — N952 Postmenopausal atrophic vaginitis: Secondary | ICD-10-CM

## 2023-12-10 DIAGNOSIS — Z01419 Encounter for gynecological examination (general) (routine) without abnormal findings: Secondary | ICD-10-CM

## 2023-12-18 ENCOUNTER — Ambulatory Visit: Payer: Medicare Other | Admitting: Obstetrics and Gynecology

## 2023-12-18 ENCOUNTER — Encounter: Payer: Self-pay | Admitting: Obstetrics and Gynecology

## 2023-12-18 VITALS — BP 121/73 | HR 86 | Ht 65.0 in | Wt 130.0 lb

## 2023-12-18 DIAGNOSIS — Z01419 Encounter for gynecological examination (general) (routine) without abnormal findings: Secondary | ICD-10-CM | POA: Diagnosis not present

## 2023-12-18 DIAGNOSIS — N952 Postmenopausal atrophic vaginitis: Secondary | ICD-10-CM

## 2023-12-18 MED ORDER — ESTRADIOL 0.1 MG/GM VA CREA: 0.2500 | TOPICAL_CREAM | Freq: Every day | VAGINAL | 3 refills | Status: AC

## 2023-12-18 NOTE — Progress Notes (Signed)
HPI:      Holly Steele is a 68 y.o. G2P1001 who LMP was No LMP recorded. Patient is postmenopausal.  Subjective:   She presents today for her annual examination.  She reports that she continues to use her estrogen vaginal cream and is doing well for her.  She would like to continue.  She has some questions about amount to use and when to use it. She has recently done Cologuard, has a mammogram and a DEXA scan scheduled in March.    Hx: The following portions of the patient's history were reviewed and updated as appropriate:             She  has a past medical history of Anxiety, Asthma, Dysplastic nevus (08/14/2020), and Osteoporosis. She does not have any pertinent problems on file. She  has a past surgical history that includes Polypectomy. Her family history includes Alcohol abuse in her father; Diabetes in her brother, father, and sister; Hypertension in her mother. She  reports that she has never smoked. She has never used smokeless tobacco. She reports current alcohol use. She reports that she does not use drugs. She has a current medication list which includes the following prescription(s): accu-chek guide test, albuterol, alendronate, calcium-vitamin d, dexcom g6 receiver, dexcom g7 sensor, dexcom g6 transmitter, escitalopram, estradiol, estradiol, repatha, jardiance, magnesium oxide, mometasone, ozempic (1 mg/dose), tretinoin microsphere, and cyanocobalamin. She is allergic to keflet [cephalexin].       Review of Systems:  Review of Systems  Constitutional: Denied constitutional symptoms, night sweats, recent illness, fatigue, fever, insomnia and weight loss.  Eyes: Denied eye symptoms, eye pain, photophobia, vision change and visual disturbance.  Ears/Nose/Throat/Neck: Denied ear, nose, throat or neck symptoms, hearing loss, nasal discharge, sinus congestion and sore throat.  Cardiovascular: Denied cardiovascular symptoms, arrhythmia, chest pain/pressure, edema, exercise  intolerance, orthopnea and palpitations.  Respiratory: Denied pulmonary symptoms, asthma, pleuritic pain, productive sputum, cough, dyspnea and wheezing.  Gastrointestinal: Denied, gastro-esophageal reflux, melena, nausea and vomiting.  Genitourinary: Denied genitourinary symptoms including symptomatic vaginal discharge, pelvic relaxation issues, and urinary complaints.  Musculoskeletal: Denied musculoskeletal symptoms, stiffness, swelling, muscle weakness and myalgia.  Dermatologic: Denied dermatology symptoms, rash and scar.  Neurologic: Denied neurology symptoms, dizziness, headache, neck pain and syncope.  Psychiatric: Denied psychiatric symptoms, anxiety and depression.  Endocrine: Denied endocrine symptoms including hot flashes and night sweats.   Meds:   Current Outpatient Medications on File Prior to Visit  Medication Sig Dispense Refill   ACCU-CHEK GUIDE TEST test strip Use to check blood sugar up to 1 x daily if needed. 100 each 1   albuterol (VENTOLIN HFA) 108 (90 Base) MCG/ACT inhaler Inhale 2 puffs into the lungs every 4 (four) hours as needed for wheezing or shortness of breath (cough). 6.7 g 2   alendronate (FOSAMAX) 70 MG tablet Take 1 tablet (70 mg total) by mouth once a week. 12 tablet 3   calcium-vitamin D (OSCAL WITH D) 500-200 MG-UNIT tablet Take 1 tablet by mouth.     Continuous Blood Gluc Receiver (DEXCOM G6 RECEIVER) DEVI      Continuous Glucose Sensor (DEXCOM G7 SENSOR) MISC USE SENSOR TO CHECK BLOOD SUGAR AS DIRECTED. REPLACE SENSOR EVERY 10 DAYS 9 each 3   Continuous Glucose Transmitter (DEXCOM G6 TRANSMITTER) MISC USE TO CHECK BLOOD SUGAR ASDIRECTED 1 each 3   escitalopram (LEXAPRO) 10 MG tablet Take 1 tablet (10 mg total) by mouth daily. 90 tablet 3   estradiol (ESTRACE) 0.1 MG/GM vaginal cream PLACE  1 APPLICATORFUL VAGINALLY AT BEDTIME. 42.5 g 0   Evolocumab (REPATHA) 140 MG/ML SOSY INJECT 140 MG INTO THE SKIN EVERY 14 (FOURTEEN) DAYS. 6 mL 3   JARDIANCE 10 MG  TABS tablet Take 1 tablet (10 mg total) by mouth daily before breakfast. 90 tablet 3   magnesium oxide (MAG-OX) 400 MG tablet Take 400 mg by mouth daily.     mometasone (ELOCON) 0.1 % cream Apply to rash QD-BID PRN up to two weeks. Then decrease use to QD-BID PRN up to 5d/wk. 45 g 0   OZEMPIC, 1 MG/DOSE, 4 MG/3ML SOPN Inject 1 mg into the skin once a week. 9 mL 3   Tretinoin Microsphere (RETIN-A MICRO PUMP) 0.08 % GEL Apply to face at bedtime 50 g 6   vitamin B-12 (CYANOCOBALAMIN) 1000 MCG tablet Take 1,000 mcg by mouth every other day.     No current facility-administered medications on file prior to visit.     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 Well woman exam with routine gynecological exam   2. Vaginal atrophy     Doing well with vaginal estrogen for atrophy.  She would like to continue   Plan:            1.  Basic Screening Recommendations The basic screening recommendations for asymptomatic women were discussed with the patient during her visit.  The age-appropriate recommendations were discussed with her and the rational for the tests reviewed.  When I  am informed by the patient that another primary care physician has previously obtained the age-appropriate tests and they are up-to-date, only outstanding tests are ordered and referrals given as necessary.  Abnormal results of tests will be discussed with her when all of her results are completed.  Routine preventative health maintenance measures emphasized: Exercise/Diet/Weight control, Tobacco Warnings, Alcohol/Substance use risks and Stress Management 2.  Continue vaginal estrogen.  Advised one third applicator twice weekly.  Orders No orders of the defined types were placed in this encounter.    Meds ordered this encounter  Medications   estradiol (ESTRACE) 0.1 MG/GM vaginal cream    Sig: Place 0.25 Applicatorfuls vaginally at bedtime. Place 1/3 applicator of cream vaginally at bedtime 2 times per week.    Dispense:  90 g    Refill:  3          F/U  Return in about 1 year (around 12/17/2024) for Annual Physical.  Elonda Husky, M.D. 12/18/2023 1:46 PM

## 2023-12-18 NOTE — Progress Notes (Signed)
Patients presents for annual exam today. She states wanting to discuss HRT and correct dosage. Due for mammogram, scheduled in March. Annual labs are up to date. Aged out of pap smears. She states no other questions or concerns at this time.

## 2023-12-23 ENCOUNTER — Ambulatory Visit
Admission: RE | Admit: 2023-12-23 | Discharge: 2023-12-23 | Disposition: A | Discharge status: Home / Self Care | Payer: Self-pay | Source: Ambulatory Visit | Attending: Family Medicine | Admitting: Family Medicine

## 2023-12-23 DIAGNOSIS — E7801 Familial hypercholesterolemia: Secondary | ICD-10-CM | POA: Insufficient documentation

## 2023-12-24 ENCOUNTER — Encounter: Payer: Self-pay | Admitting: Family Medicine

## 2024-01-15 ENCOUNTER — Ambulatory Visit
Admission: RE | Admit: 2024-01-15 | Discharge: 2024-01-15 | Disposition: A | Discharge status: Home / Self Care | Payer: Medicare Other | Source: Ambulatory Visit | Attending: Family Medicine | Admitting: Family Medicine

## 2024-01-15 DIAGNOSIS — M816 Localized osteoporosis [Lequesne]: Secondary | ICD-10-CM | POA: Diagnosis present

## 2024-01-15 DIAGNOSIS — Z1231 Encounter for screening mammogram for malignant neoplasm of breast: Secondary | ICD-10-CM | POA: Diagnosis present

## 2024-01-16 ENCOUNTER — Encounter: Payer: Self-pay | Admitting: Family Medicine

## 2024-01-26 LAB — HM DIABETES EYE EXAM

## 2024-02-02 ENCOUNTER — Encounter: Payer: Self-pay | Admitting: Family Medicine

## 2024-04-29 ENCOUNTER — Telehealth: Payer: Self-pay | Admitting: Family Medicine

## 2024-04-29 NOTE — Telephone Encounter (Signed)
 LVM 04/29/2024 to schedule AWV. Please schedule office or virtual visits.  Holly Steele; Care Guide Ambulatory Clinical Support [place] [facility] Direct Dial: [phone]

## 2024-05-04 ENCOUNTER — Other Ambulatory Visit: Payer: Self-pay | Admitting: Family Medicine

## 2024-05-04 DIAGNOSIS — M816 Localized osteoporosis [Lequesne]: Secondary | ICD-10-CM

## 2024-05-06 NOTE — Telephone Encounter (Signed)
 Requested Prescriptions  Pending Prescriptions Disp Refills   alendronate  (FOSAMAX ) 70 MG tablet [Pharmacy Med Name: ALENDRONATE   TAB 70MG ] 12 tablet 1    Sig: TAKE 1 TABLET ONCE WEEKLY     Endocrinology:  Bisphosphonates Failed - 05/06/2024  3:39 PM      Failed - Cr in normal range and within 360 days    Creat  Date Value Ref Range Status  11/17/2023 1.06 (H) 0.50 - 1.05 mg/dL Final   Creatinine, Urine  Date Value Ref Range Status  11/26/2023 128 20 - 275 mg/dL Final         Failed - Mg Level in normal range and within 360 days    No results found for: MG       Failed - Phosphate in normal range and within 360 days    No results found for: PHOS       Failed - eGFR is 30 or above and within 360 days    GFR, Est African American  Date Value Ref Range Status  09/12/2020 58 (L) > OR = 60 mL/min/1.35m2 Final   GFR, Est Non African American  Date Value Ref Range Status  09/12/2020 50 (L) > OR = 60 mL/min/1.62m2 Final   eGFR  Date Value Ref Range Status  04/17/2023 51 (L) > OR = 60 mL/min/1.50m2 Final         Failed - Valid encounter within last 12 months    Recent Outpatient Visits   None     Future Appointments             In 1 month Karamalegos, Marsa PARAS, DO [facility]   In 6 months Claudene Lehmann, MD [facility]            Passed - Ca in normal range and within 360 days    Calcium  Date Value Ref Range Status  11/17/2023 10.2 8.6 - 10.4 mg/dL Final         Passed - Vitamin D  in normal range and within 360 days    Vitamin D2 1, 25 (OH)2  Date Value Ref Range Status  11/17/2023 <8 pg/mL Final    Comment:    (Note) Vitamin D3, 1,25(OH)2 indicates both endogenous  production and supplementation. Vitamin D2, 1,25(OH)2 is  an indicator of exogenous sources, such as diet or  supplementation. Interpretation and therapy are based on  measurement of Vitamin D , 1,25 (OH)2, Total. . This test was  developed, and its analytical performance  characteristics have been determined by [employer]. It has not been cleared or approved by the  FDA. This assay has been validated pursuant to the CLIA  regulations and is used for clinical purposes. . For additional information, please refer to http://education.[facility].com/faq/FAQ199 (This link is being provided for  informational/educational purposes only.) . [facility] med fusion [address] [place] [address] [phone] Johanna Agent L. Gino, MD, PhD    Vitamin D3 1, 25 (OH)2  Date Value Ref Range Status  11/17/2023 37 pg/mL Final   Vitamin D  1, 25 (OH)2 Total  Date Value Ref Range Status  11/17/2023 37 18 - 72 pg/mL Final   Vit D, 25-Hydroxy  Date Value Ref Range Status  04/17/2023 107 (H) 30 - 100 ng/mL Final    Comment:    Vitamin D  Status         25-OH Vitamin D : . Deficiency:                    <  20 ng/mL Insufficiency:             20 - 29 ng/mL Optimal:                 > or = 30 ng/mL . For 25-OH Vitamin D  testing on patients on  D2-supplementation and patients for whom quantitation  of D2 and D3 fractions is required, the [employer](TM) 25-OH VIT D, (D2,D3), LC/MS/MS is recommended: order  code 07111 (patients >38yrs). . See Note 1 . Note 1 . For additional information, please refer to  http://education.[facility].com/faq/FAQ199  (This link is being provided for informational/ educational purposes only.)          Passed - Bone Mineral Density or Dexa Scan completed in the last 2 years

## 2024-05-25 ENCOUNTER — Ambulatory Visit: Payer: Self-pay | Admitting: Family Medicine

## 2024-06-21 ENCOUNTER — Ambulatory Visit: Admitting: Family Medicine

## 2024-07-09 ENCOUNTER — Other Ambulatory Visit: Payer: Self-pay | Admitting: Medical Genetics

## 2024-07-11 ENCOUNTER — Other Ambulatory Visit: Payer: Self-pay | Admitting: Family Medicine

## 2024-07-11 DIAGNOSIS — N1831 Chronic kidney disease, stage 3a: Secondary | ICD-10-CM

## 2024-07-11 DIAGNOSIS — E1121 Type 2 diabetes mellitus with diabetic nephropathy: Secondary | ICD-10-CM

## 2024-07-13 NOTE — Telephone Encounter (Signed)
 Requested medications are due for refill today.  yes  Requested medications are on the active medications list.  yes  Last refill. 02/26/2023 #90 3 rf  Future visit scheduled.   yes  Notes to clinic.  Labs are expired.    Requested Prescriptions  Pending Prescriptions Disp Refills   JARDIANCE  10 MG TABS tablet [Pharmacy Med Name: JARDIANCE  TAB 10MG ] 90 tablet 3    Sig: TAKE 1 TABLET DAILY BEFORE BREAKFAST     Endocrinology:  Diabetes - SGLT2 Inhibitors Failed - 07/13/2024  4:59 PM      Failed - Cr in normal range and within 360 days    Creat  Date Value Ref Range Status  11/17/2023 1.06 (H) 0.50 - 1.05 mg/dL Final   Creatinine, Urine  Date Value Ref Range Status  11/26/2023 128 20 - 275 mg/dL Final         Failed - HBA1C is between 0 and 7.9 and within 180 days    Hemoglobin A1C  Date Value Ref Range Status  10/26/2019 8.7  Final   Hgb A1c MFr Bld  Date Value Ref Range Status  11/17/2023 7.2 (H) <5.7 % of total Hgb Final    Comment:    For someone without known diabetes, a hemoglobin A1c value of 6.5% or greater indicates that they may have  diabetes and this should be confirmed with a follow-up  test. . For someone with known diabetes, a value <7% indicates  that their diabetes is well controlled and a value  greater than or equal to 7% indicates suboptimal  control. A1c targets should be individualized based on  duration of diabetes, age, comorbid conditions, and  other considerations. . Currently, no consensus exists regarding use of hemoglobin A1c for diagnosis of diabetes for children. .          Failed - eGFR in normal range and within 360 days    GFR, Est African American  Date Value Ref Range Status  09/12/2020 58 (L) > OR = 60 mL/min/1.42m2 Final   GFR, Est Non African American  Date Value Ref Range Status  09/12/2020 50 (L) > OR = 60 mL/min/1.58m2 Final   eGFR  Date Value Ref Range Status  04/17/2023 51 (L) > OR = 60 mL/min/1.70m2 Final          Failed - Valid encounter within last 6 months    Recent Outpatient Visits   None     Future Appointments             In 6 days Edman, Marsa PARAS, DO [place] [facility] [facility], [place]   In 4 months Claudene Lehmann, MD [facility]

## 2024-07-14 ENCOUNTER — Telehealth: Payer: Self-pay

## 2024-07-14 DIAGNOSIS — N1831 Chronic kidney disease, stage 3a: Secondary | ICD-10-CM

## 2024-07-14 DIAGNOSIS — E1121 Type 2 diabetes mellitus with diabetic nephropathy: Secondary | ICD-10-CM

## 2024-07-14 MED ORDER — JARDIANCE 10 MG PO TABS: 10.0000 mg | ORAL_TABLET | Freq: Every day | ORAL | 0 refills | Status: DC

## 2024-07-14 NOTE — Telephone Encounter (Signed)
 Copied from CRM #[id number]. Topic: Clinical - Prescription Issue >> Jul 14, 2024 11:44 AM [name] wrote: Reason for CRM: Patient called.Holly Steele asking for only one script for jardiance  to [facility] [address] [place] [place], [place] [address] [phone] And the rest will go to mail service.SABRAafter her appt Monday.Holly Steele Pls review/advise

## 2024-07-14 NOTE — Addendum Note (Signed)
 Addended by: EDMAN MARSA PARAS on: 07/14/2024 01:11 PM   Modules accepted: Orders

## 2024-07-14 NOTE — Telephone Encounter (Signed)
 Sure 30 day jardiance  sent to [facility] [place].  Marsa Officer, DO [facility] 07/14/2024, 1:11 PM

## 2024-07-14 NOTE — Telephone Encounter (Signed)
 Patient advised.

## 2024-07-15 ENCOUNTER — Other Ambulatory Visit: Payer: Self-pay

## 2024-07-19 ENCOUNTER — Encounter: Payer: Self-pay | Admitting: Family Medicine

## 2024-07-19 ENCOUNTER — Ambulatory Visit (INDEPENDENT_AMBULATORY_CARE_PROVIDER_SITE_OTHER): Admitting: Family Medicine

## 2024-07-19 VITALS — BP 122/70 | HR 73 | Ht 65.0 in | Wt 126.1 lb

## 2024-07-19 DIAGNOSIS — E1121 Type 2 diabetes mellitus with diabetic nephropathy: Secondary | ICD-10-CM

## 2024-07-19 DIAGNOSIS — E7801 Familial hypercholesterolemia: Secondary | ICD-10-CM | POA: Diagnosis not present

## 2024-07-19 DIAGNOSIS — Z23 Encounter for immunization: Secondary | ICD-10-CM | POA: Diagnosis not present

## 2024-07-19 DIAGNOSIS — Z7985 Long-term (current) use of injectable non-insulin antidiabetic drugs: Secondary | ICD-10-CM

## 2024-07-19 DIAGNOSIS — N1831 Chronic kidney disease, stage 3a: Secondary | ICD-10-CM | POA: Diagnosis not present

## 2024-07-19 DIAGNOSIS — G720 Drug-induced myopathy: Secondary | ICD-10-CM

## 2024-07-19 LAB — POCT GLYCOSYLATED HEMOGLOBIN (HGB A1C): Hemoglobin A1C: 6.5 % — AB (ref 4.0–5.6)

## 2024-07-19 MED ORDER — JARDIANCE 10 MG PO TABS: 10.0000 mg | ORAL_TABLET | Freq: Every day | ORAL | 3 refills | Status: AC

## 2024-07-19 MED ORDER — DEXCOM G7 SENSOR MISC: 3 refills | Status: AC

## 2024-07-19 MED ORDER — REPATHA 140 MG/ML ~~LOC~~ SOSY: 140.0000 mg | PREFILLED_SYRINGE | SUBCUTANEOUS | 3 refills | Status: DC

## 2024-07-19 MED ORDER — OZEMPIC (1 MG/DOSE) 4 MG/3ML ~~LOC~~ SOPN: 1.0000 mg | PEN_INJECTOR | SUBCUTANEOUS | 3 refills | Status: AC

## 2024-07-19 NOTE — Progress Notes (Unsigned)
 Subjective:    Patient ID: Holly Steele, female    DOB: 04-Jun-1956, 68 y.o.   MRN: [id number]  Holly Steele is a 68 y.o. female presenting on 07/19/2024 for Diabetes   HPI  Discussed the use of AI scribe software for clinical note transcription with the patient, who gave verbal consent to proceed.  History of Present Illness   Holly Steele is a 68 year old female who presents for a routine follow-up visit.  Osteoporosis management - Bone density scan completed in March - Taking alendronate  since 2021 - Supplementing with vitamin D  and calcium - Maintains regular physical activity  Preventive health maintenance - Received influenza vaccination - Up to date on pneumococcal and shingles vaccinations - Mammogram scheduled for this month - Colon cancer screening not due until 2027  CHRONIC DM, Type 2 / CKD IIIa Familial Hyperlipidemia Drug Induced Myopathy  Elevated A1c with improvement now to 6.5 prior range 7.2-6.9 CBGs - continuous glucose monitor, DexCom6 - refill 9 sensors (1 box 3 sensor x 3 = 90 day), 1 transmitter - previously covered by insurance Still uses rare Accu-chek guide CBG readings to confirm at times Meds: Ozempic  1mg  weekly inj, Jardiance  10mg  daily Reports good compliance. Tolerating well w/o side-effects Last DM [facility] Lifestyle: - Diet Low Carb Diet, see above - going to gym more and walking Denies hypoglycemia, polyuria, visual changes, numbness or tingling.   Osteoporosis On prior DEXA, last done 2025, improved on fosamax  since 2021 She prefers to DC for now and monitor progress off for 2 year hiatus.   Insomnia Anxiety Continue Escitalopram  10mg  daily    History Vitamin B12 Deficiency Lab result 527, slightly down from previous 731. She is taking B12 every other day.   Vitamin D  Deficiency Improved at Vit D 37     Health Maintenance   Flu Vaccine updated   Shingrix  updated, Prevnar 20 updated   Cologuard 05/27/23  negative, repeat 3 years, 2027   Mammogram due 2025  DEXA bone density in March 2027. 2 years later. Off of Fosamax        07/19/2024    9:51 AM 04/08/2023    2:16 PM 04/08/2023    2:15 PM  Depression screen PHQ 2/9  Decreased Interest 0 0 0  Down, Depressed, Hopeless 0 0 0  PHQ - 2 Score 0 0 0  Altered sleeping 2 1   Tired, decreased energy 1 1   Change in appetite  0   Feeling bad or failure about yourself  0 0   Trouble concentrating 0 0   Moving slowly or fidgety/restless 0 0   Suicidal thoughts 0 0   PHQ-9 Score 3 2   Difficult doing work/chores Somewhat difficult Not difficult at all        07/19/2024    9:51 AM 04/08/2023    2:16 PM 09/12/2022    9:23 AM 03/06/2022   10:09 AM  GAD 7 : Generalized Anxiety Score  Nervous, Anxious, on Edge 1 1 1 1   Control/stop worrying 1 1 0 0  Worry too much - different things 1 0 0 0  Trouble relaxing 1 0 0 0  Restless 0 0 0 0  Easily annoyed or irritable 0 0 0 0  Afraid - awful might happen 0 0 0 0  Total GAD 7 Score 4 2 1 1   Anxiety Difficulty Not difficult at all Not difficult at all Not difficult at all Not difficult at all  Social History   Tobacco Use   Smoking status: Never   Smokeless tobacco: Never  Vaping Use   Vaping status: Never Used  Substance Use Topics   Alcohol use: Yes    Comment: Social    Drug use: Never    Review of Systems Per HPI unless specifically indicated above     Objective:    BP 122/70 (BP Location: Right Arm, Patient Position: Sitting, Cuff Size: Normal)   Pulse 73   Ht 5' 5 (1.651 m)   Wt 126 lb 2 oz (57.2 kg)   SpO2 98%   BMI 20.99 kg/m   Wt Readings from Last 3 Encounters:  07/19/24 126 lb 2 oz (57.2 kg)  12/18/23 130 lb (59 kg)  11/26/23 131 lb (59.4 kg)    Physical Exam Vitals and nursing note reviewed.  Constitutional:      General: She is not in acute distress.    Appearance: She is well-developed. She is not diaphoretic.     Comments: Well-appearing, comfortable,  cooperative  HENT:     Head: Normocephalic and atraumatic.  Eyes:     General:        Right eye: No discharge.        Left eye: No discharge.     Conjunctiva/sclera: Conjunctivae normal.  Neck:     Thyroid: No thyromegaly.  Cardiovascular:     Rate and Rhythm: Normal rate and regular rhythm.     Heart sounds: Normal heart sounds. No murmur heard. Pulmonary:     Effort: Pulmonary effort is normal. No respiratory distress.     Breath sounds: Normal breath sounds. No wheezing or rales.  Musculoskeletal:        General: Normal range of motion.     Cervical back: Normal range of motion and neck supple.  Lymphadenopathy:     Cervical: No cervical adenopathy.  Skin:    General: Skin is warm and dry.     Findings: No erythema or rash.  Neurological:     Mental Status: She is alert and oriented to person, place, and time.  Psychiatric:        Behavior: Behavior normal.     Comments: Well groomed, good eye contact, normal speech and thoughts     Results for orders placed or performed in visit on 07/19/24  POCT HgB A1C   Collection Time: 07/19/24  9:51 AM  Result Value Ref Range   Hemoglobin A1C 6.5 (A) 4.0 - 5.6 %   HbA1c POC (<> result, manual entry)     HbA1c, POC (prediabetic range)     HbA1c, POC (controlled diabetic range)        Assessment & Plan:   Problem List Items Addressed This Visit     CKD (chronic kidney disease), stage III (HCC)   Relevant Medications   JARDIANCE  10 MG TABS tablet   Controlled type 2 diabetes mellitus with diabetic nephropathy (HCC) - Primary   Relevant Medications   JARDIANCE  10 MG TABS tablet   Continuous Glucose Sensor (DEXCOM G7 SENSOR) MISC   OZEMPIC , 1 MG/DOSE, 4 MG/3ML SOPN   Other Relevant Orders   POCT HgB A1C (Completed)   Diabetic nephropathy associated with type 2 diabetes mellitus (HCC)   Relevant Medications   JARDIANCE  10 MG TABS tablet   OZEMPIC , 1 MG/DOSE, 4 MG/3ML SOPN   Drug-induced myopathy   Familial  hypercholesterolemia   Relevant Medications   REPATHA  140 MG/ML SOSY   Other Visit Diagnoses  Flu vaccine need       Relevant Orders   Flu vaccine trivalent PF, 6mos and older(Flulaval,Afluria,Fluarix,Fluzone) (Completed)     Long-term current use of injectable noninsulin antidiabetic medication            Type 2 diabetes mellitus with diabetic nephropathy CKD IIIa Controlled Blood sugar levels improved. A1c at 6.5, down from 6.9 and 6.7. Potential for further improvement with routine. - Continue Jardiance , Ozempic   - Refill Jardiance  90-day supply via [facility]. - Order glucose monitoring sensors for CGM Dexcom  Familial Hyperlipidemia Drug Induced Myopathy - On Repatha  PCSK9 instead of statin therapy due to drug induced myopathy  Age-related osteoporosis without current pathological fracture Bone density improved. Current treatment effective. 4-5 years in. Discussed potential alendronate  side effects. Decided to discontinue and re-evaluate in two years on repeat DEXA - Discontinue alendronate  after current supply. - Schedule DEXA scan for March 2027.        Orders Placed This Encounter  Procedures   Flu vaccine trivalent PF, 6mos and older(Flulaval,Afluria,Fluarix,Fluzone)   POCT HgB A1C    Meds ordered this encounter  Medications   JARDIANCE  10 MG TABS tablet    Sig: Take 1 tablet (10 mg total) by mouth daily before breakfast.    Dispense:  90 tablet    Refill:  3   Continuous Glucose Sensor (DEXCOM G7 SENSOR) MISC    Sig: USE SENSOR TO CHECK BLOOD SUGAR AS DIRECTED. REPLACE SENSOR EVERY 10 DAYS    Dispense:  9 each    Refill:  3   REPATHA  140 MG/ML SOSY    Sig: Inject 140 mg into the skin every 14 (fourteen) days.    Dispense:  6 mL    Refill:  3   OZEMPIC , 1 MG/DOSE, 4 MG/3ML SOPN    Sig: Inject 1 mg into the skin once a week.    Dispense:  9 mL    Refill:  3    Follow up plan: Return for 6 month fasting lab > 1 week later Annual  Physical.  Future labs ordered for 01/18/25  Marsa Officer, DO [facility] 07/19/2024, 9:54 AM

## 2024-07-19 NOTE — Patient Instructions (Addendum)
 Thank you for coming to the office today.  Flu Shot today  Refilled [facility] to [facility]  Stop Fosamax , repeat Bone Density 01/2026  Please schedule a Follow-up Appointment to: Return for 6 month fasting lab > 1 week later Annual Physical.  If you have any other questions or concerns, please feel free to call the office or send a message through MyChart. You may also schedule an earlier appointment if necessary.  Additionally, you may be receiving a survey about your experience at our office within a few days to 1 week by e-mail or mail. We value your feedback.  Marsa Officer, DO [facility], [place]

## 2024-07-20 ENCOUNTER — Other Ambulatory Visit
Admission: RE | Admit: 2024-07-20 | Discharge: 2024-07-20 | Disposition: A | Discharge status: Home / Self Care | Payer: Self-pay | Source: Ambulatory Visit | Attending: Medical Genetics | Admitting: Medical Genetics

## 2024-07-20 ENCOUNTER — Other Ambulatory Visit: Payer: Self-pay | Admitting: Family Medicine

## 2024-07-20 DIAGNOSIS — E1121 Type 2 diabetes mellitus with diabetic nephropathy: Secondary | ICD-10-CM

## 2024-07-20 DIAGNOSIS — N1831 Chronic kidney disease, stage 3a: Secondary | ICD-10-CM

## 2024-07-20 DIAGNOSIS — E559 Vitamin D deficiency, unspecified: Secondary | ICD-10-CM

## 2024-07-20 DIAGNOSIS — E538 Deficiency of other specified B group vitamins: Secondary | ICD-10-CM

## 2024-07-20 DIAGNOSIS — E7801 Familial hypercholesterolemia: Secondary | ICD-10-CM

## 2024-07-26 LAB — HM DIABETES EYE EXAM

## 2024-07-30 LAB — GENECONNECT MOLECULAR SCREEN: Genetic Analysis Overall Interpretation: NEGATIVE

## 2024-08-12 LAB — OPHTHALMOLOGY REPORT-SCANNED

## 2024-10-22 ENCOUNTER — Other Ambulatory Visit: Payer: Self-pay | Admitting: Family Medicine

## 2024-10-22 DIAGNOSIS — E78019 Familial hypercholesterolemia, unspecified: Secondary | ICD-10-CM

## 2024-10-26 NOTE — Telephone Encounter (Signed)
 Requested Prescriptions  Pending Prescriptions Disp Refills   REPATHA  140 MG/ML SOSY [Pharmacy Med Name: REPATHA  140 MG/ML SYRINGE] 6 mL 3    Sig: INJECT 140 MG INTO THE SKIN EVERY 14 (FOURTEEN) DAYS.     Cardiovascular: PCSK9 Inhibitors Passed - 10/26/2024  9:58 AM      Passed - Valid encounter within last 12 months    Recent Outpatient Visits           3 months ago Controlled type 2 diabetes mellitus with diabetic nephropathy, without long-term current use of insulin [facility])   [place] [facility] Edman, Marsa PARAS, DO       Future Appointments             In 1 month Claudene Lehmann, MD [facility]            Passed - Lipid Panel completed within the last 12 months    Cholesterol  Date Value Ref Range Status  11/17/2023 145 <200 mg/dL Final   LDL Cholesterol (Calc)  Date Value Ref Range Status  11/17/2023 70 mg/dL (calc) Final    Comment:    Reference range: <100 . Desirable range <100 mg/dL for primary prevention;   <70 mg/dL for patients with CHD or diabetic patients  with > or = 2 CHD risk factors. [name] LDL-C is now calculated using the Martin-Hopkins  calculation, which is a validated novel method providing  better accuracy than the Friedewald equation in the  estimation of LDL-C.  Gladis APPLETHWAITE et al. SANDREA. 7986;689(80): 2061-2068  (http://education.[employer].com/faq/FAQ164)    HDL  Date Value Ref Range Status  11/17/2023 51 > OR = 50 mg/dL Final   Triglycerides  Date Value Ref Range Status  11/17/2023 153 (H) <150 mg/dL Final

## 2024-11-18 ENCOUNTER — Other Ambulatory Visit: Payer: Self-pay | Admitting: Family Medicine

## 2024-11-18 DIAGNOSIS — F5101 Primary insomnia: Secondary | ICD-10-CM

## 2024-11-19 NOTE — Telephone Encounter (Signed)
 Requested Prescriptions  Pending Prescriptions Disp Refills   escitalopram  (LEXAPRO ) 10 MG tablet [Pharmacy Med Name: ESCITALOPRAM  10 MG TABLET] 90 tablet 0    Sig: TAKE 1 TABLET BY MOUTH EVERY DAY     Psychiatry:  Antidepressants - SSRI Passed - 11/19/2024  7:49 AM      Passed - Valid encounter within last 6 months    Recent Outpatient Visits           4 months ago Controlled type 2 diabetes mellitus with diabetic nephropathy, without long-term current use of insulin [facility])   [place] [facility] [place], Marsa PARAS, DO       Future Appointments             In 1 week Holly Lehmann, MD [facility]

## 2024-11-30 ENCOUNTER — Ambulatory Visit: Payer: Medicare Other | Admitting: Dermatology

## 2024-12-22 ENCOUNTER — Ambulatory Visit: Admitting: Obstetrics

## 2025-01-18 ENCOUNTER — Other Ambulatory Visit

## 2025-01-26 ENCOUNTER — Encounter: Admitting: Family Medicine
